# Patient Record
Sex: Male | Born: 1985 | Hispanic: No | Smoking: Current every day smoker
Health system: Southern US, Community
[De-identification: ages and names within clinical notes are randomized; demographics above are authoritative.]

## PROBLEM LIST (undated history)

## (undated) DIAGNOSIS — R519 Headache, unspecified: Secondary | ICD-10-CM

## (undated) DIAGNOSIS — R51 Headache: Secondary | ICD-10-CM

## (undated) DIAGNOSIS — R42 Dizziness and giddiness: Secondary | ICD-10-CM

---

## 2014-04-04 ENCOUNTER — Emergency Department (HOSPITAL_COMMUNITY)
Admission: EM | Admit: 2014-04-04 | Discharge: 2014-04-04 | Disposition: A | Discharge status: Home / Self Care | Payer: Medicaid Other | Attending: Emergency Medicine | Admitting: Emergency Medicine

## 2014-04-04 ENCOUNTER — Encounter (HOSPITAL_COMMUNITY): Payer: Self-pay | Admitting: Emergency Medicine

## 2014-04-04 DIAGNOSIS — F17200 Nicotine dependence, unspecified, uncomplicated: Secondary | ICD-10-CM | POA: Diagnosis not present

## 2014-04-04 DIAGNOSIS — R197 Diarrhea, unspecified: Secondary | ICD-10-CM | POA: Diagnosis not present

## 2014-04-04 DIAGNOSIS — R42 Dizziness and giddiness: Secondary | ICD-10-CM | POA: Insufficient documentation

## 2014-04-04 DIAGNOSIS — R112 Nausea with vomiting, unspecified: Secondary | ICD-10-CM | POA: Diagnosis not present

## 2014-04-04 LAB — URINALYSIS, ROUTINE W REFLEX MICROSCOPIC
Bilirubin Urine: NEGATIVE
Glucose, UA: NEGATIVE mg/dL
Hgb urine dipstick: NEGATIVE
KETONES UR: NEGATIVE mg/dL
LEUKOCYTES UA: NEGATIVE
NITRITE: NEGATIVE
PROTEIN: NEGATIVE mg/dL
Specific Gravity, Urine: 1.015 (ref 1.005–1.030)
UROBILINOGEN UA: 0.2 mg/dL (ref 0.0–1.0)
pH: 7 (ref 5.0–8.0)

## 2014-04-04 LAB — CBC WITH DIFFERENTIAL/PLATELET
BASOS ABS: 0.1 10*3/uL (ref 0.0–0.1)
Basophils Relative: 1 % (ref 0–1)
Eosinophils Absolute: 0.2 10*3/uL (ref 0.0–0.7)
Eosinophils Relative: 2 % (ref 0–5)
HCT: 43.6 % (ref 39.0–52.0)
Hemoglobin: 15.2 g/dL (ref 13.0–17.0)
LYMPHS PCT: 35 % (ref 12–46)
Lymphs Abs: 3.1 10*3/uL (ref 0.7–4.0)
MCH: 29.5 pg (ref 26.0–34.0)
MCHC: 34.9 g/dL (ref 30.0–36.0)
MCV: 84.7 fL (ref 78.0–100.0)
Monocytes Absolute: 0.6 10*3/uL (ref 0.1–1.0)
Monocytes Relative: 7 % (ref 3–12)
Neutro Abs: 5 10*3/uL (ref 1.7–7.7)
Neutrophils Relative %: 55 % (ref 43–77)
PLATELETS: 310 10*3/uL (ref 150–400)
RBC: 5.15 MIL/uL (ref 4.22–5.81)
RDW: 12.6 % (ref 11.5–15.5)
WBC: 8.9 10*3/uL (ref 4.0–10.5)

## 2014-04-04 LAB — BASIC METABOLIC PANEL
ANION GAP: 11 (ref 5–15)
BUN: 12 mg/dL (ref 6–23)
CALCIUM: 9.2 mg/dL (ref 8.4–10.5)
CO2: 28 mEq/L (ref 19–32)
Chloride: 101 mEq/L (ref 96–112)
Creatinine, Ser: 1.06 mg/dL (ref 0.50–1.35)
GFR calc Af Amer: >90 mL/min (ref >90)
Glucose, Bld: 85 mg/dL (ref 70–99)
Potassium: 3.8 mEq/L (ref 3.7–5.3)
SODIUM: 140 meq/L (ref 137–147)

## 2014-04-04 MED ORDER — ONDANSETRON HCL 4 MG/2ML IJ SOLN
4.0000 mg | Freq: Once | INTRAMUSCULAR | Status: AC
  Administered 2014-04-04: 4 mg via INTRAVENOUS
  Filled 2014-04-04: qty 2

## 2014-04-04 MED ORDER — ONDANSETRON HCL 4 MG PO TABS: 4.0000 mg | ORAL_TABLET | Freq: Four times a day (QID) | ORAL | Status: DC | PRN

## 2014-04-04 MED ORDER — LOPERAMIDE HCL 2 MG PO CAPS: 2.0000 mg | ORAL_CAPSULE | Freq: Four times a day (QID) | ORAL | Status: DC | PRN

## 2014-04-04 MED ORDER — SODIUM CHLORIDE 0.9 % IV SOLN
1000.0000 mL | Freq: Once | INTRAVENOUS | Status: AC
  Administered 2014-04-04: 1000 mL via INTRAVENOUS

## 2014-04-04 MED ORDER — SODIUM CHLORIDE 0.9 % IV SOLN: 1000.0000 mL | INTRAVENOUS | Status: DC

## 2014-04-04 MED ORDER — LOPERAMIDE HCL 2 MG PO CAPS
4.0000 mg | ORAL_CAPSULE | Freq: Once | ORAL | Status: AC
  Administered 2014-04-04: 4 mg via ORAL
  Filled 2014-04-04: qty 2

## 2014-04-04 NOTE — ED Notes (Signed)
Pt alert & oriented x4, stable gait. Patient given discharge instructions, paperwork & prescription(s). Patient  instructed to stop at the registration desk to finish any additional paperwork. Patient verbalized understanding. Pt left department w/ no further questions. 

## 2014-04-04 NOTE — Discharge Instructions (Signed)
Nausea and Vomiting °Nausea is a sick feeling that often comes before throwing up (vomiting). Vomiting is a reflex where stomach contents come out of your mouth. Vomiting can cause severe loss of body fluids (dehydration). Children and elderly adults can become dehydrated quickly, especially if they also have diarrhea. Nausea and vomiting are symptoms of a condition or disease. It is important to find the cause of your symptoms. °CAUSES  °· Direct irritation of the stomach lining. This irritation can result from increased acid production (gastroesophageal reflux disease), infection, food poisoning, taking certain medicines (such as nonsteroidal anti-inflammatory drugs), alcohol use, or tobacco use. °· Signals from the brain. These signals could be caused by a headache, heat exposure, an inner ear disturbance, increased pressure in the brain from injury, infection, a tumor, or a concussion, pain, emotional stimulus, or metabolic problems. °· An obstruction in the gastrointestinal tract (bowel obstruction). °· Illnesses such as diabetes, hepatitis, gallbladder problems, appendicitis, kidney problems, cancer, sepsis, atypical symptoms of a heart attack, or eating disorders. °· Medical treatments such as chemotherapy and radiation. °· Receiving medicine that makes you sleep (general anesthetic) during surgery. °DIAGNOSIS °Your caregiver may ask for tests to be done if the problems do not improve after a few days. Tests may also be done if symptoms are severe or if the reason for the nausea and vomiting is not clear. Tests may include: °· Urine tests. °· Blood tests. °· Stool tests. °· Cultures (to look for evidence of infection). °· X-rays or other imaging studies. °Test results can help your caregiver make decisions about treatment or the need for additional tests. °TREATMENT °You need to stay well hydrated. Drink frequently but in small amounts. You may wish to drink water, sports drinks, clear broth, or eat frozen  ice pops or gelatin dessert to help stay hydrated. When you eat, eating slowly may help prevent nausea. There are also some antinausea medicines that may help prevent nausea. °HOME CARE INSTRUCTIONS  °· Take all medicine as directed by your caregiver. °· If you do not have an appetite, do not force yourself to eat. However, you must continue to drink fluids. °· If you have an appetite, eat a normal diet unless your caregiver tells you differently. °¨ Eat a variety of complex carbohydrates (rice, wheat, potatoes, bread), lean meats, yogurt, fruits, and vegetables. °¨ Avoid high-fat foods because they are more difficult to digest. °· Drink enough water and fluids to keep your urine clear or pale yellow. °· If you are dehydrated, ask your caregiver for specific rehydration instructions. Signs of dehydration may include: °¨ Severe thirst. °¨ Dry lips and mouth. °¨ Dizziness. °¨ Dark urine. °¨ Decreasing urine frequency and amount. °¨ Confusion. °¨ Rapid breathing or pulse. °SEEK IMMEDIATE MEDICAL CARE IF:  °· You have blood or brown flecks (like coffee grounds) in your vomit. °· You have black or bloody stools. °· You have a severe headache or stiff neck. °· You are confused. °· You have severe abdominal pain. °· You have chest pain or trouble breathing. °· You do not urinate at least once every 8 hours. °· You develop cold or clammy skin. °· You continue to vomit for longer than 24 to 48 hours. °· You have a fever. °MAKE SURE YOU:  °· Understand these instructions. °· Will watch your condition. °· Will get help right away if you are not doing well or get worse. °Document Released: 07/22/2005 Document Revised: 10/14/2011 Document Reviewed: 12/19/2010 °ExitCare® Patient Information ©2015 [employer]. This information is not intended   to replace advice given to you by your health care provider. Make sure you discuss any questions you have with your health care provider. ° °Diarrhea °Diarrhea is frequent loose and watery  bowel movements. It can cause you to feel weak and dehydrated. Dehydration can cause you to become tired and thirsty, have a dry mouth, and have decreased urination that often is dark yellow. Diarrhea is a sign of another problem, most often an infection that will not last long. In most cases, diarrhea typically lasts 2-3 days. However, it can last longer if it is a sign of something more serious. It is important to treat your diarrhea as directed by your caregiver to lessen or prevent future episodes of diarrhea. °CAUSES  °Some common causes include: °· Gastrointestinal infections caused by viruses, bacteria, or parasites. °· Food poisoning or food allergies. °· Certain medicines, such as antibiotics, chemotherapy, and laxatives. °· Artificial sweeteners and fructose. °· Digestive disorders. °HOME CARE INSTRUCTIONS °· Ensure adequate fluid intake (hydration): Have 1 cup (8 oz) of fluid for each diarrhea episode. Avoid fluids that contain simple sugars or sports drinks, fruit juices, whole milk products, and sodas. Your urine should be clear or pale yellow if you are drinking enough fluids. Hydrate with an oral rehydration solution that you can purchase at pharmacies, retail stores, and online. You can prepare an oral rehydration solution at home by mixing the following ingredients together: °¨  - tsp table salt. °¨ ¾ tsp baking soda. °¨  tsp salt substitute containing potassium chloride. °¨ 1  tablespoons sugar. °¨ 1 L (34 oz) of water. °· Certain foods and beverages may increase the speed at which food moves through the gastrointestinal (GI) tract. These foods and beverages should be avoided and include: °¨ Caffeinated and alcoholic beverages. °¨ High-fiber foods, such as raw fruits and vegetables, nuts, seeds, and whole grain breads and cereals. °¨ Foods and beverages sweetened with sugar alcohols, such as xylitol, sorbitol, and mannitol. °· Some foods may be well tolerated and may help thicken stool  including: °¨ Starchy foods, such as rice, toast, pasta, low-sugar cereal, oatmeal, grits, baked potatoes, crackers, and bagels. °¨ Bananas. °¨ Applesauce. °· Add probiotic-rich foods to help increase healthy bacteria in the GI tract, such as yogurt and fermented milk products. °· Wash your hands well after each diarrhea episode. °· Only take over-the-counter or prescription medicines as directed by your caregiver. °· Take a warm bath to relieve any burning or pain from frequent diarrhea episodes. °SEEK IMMEDIATE MEDICAL CARE IF:  °· You are unable to keep fluids down. °· You have persistent vomiting. °· You have blood in your stool, or your stools are black and tarry. °· You do not urinate in 6-8 hours, or there is only a small amount of very dark urine. °· You have abdominal pain that increases or localizes. °· You have weakness, dizziness, confusion, or light-headedness. °· You have a severe headache. °· Your diarrhea gets worse or does not get better. °· You have a fever or persistent symptoms for more than 2-3 days. °· You have a fever and your symptoms suddenly get worse. °MAKE SURE YOU:  °· Understand these instructions. °· Will watch your condition. °· Will get help right away if you are not doing well or get worse. °Document Released: 07/12/2002 Document Revised: 12/06/2013 Document Reviewed: 03/29/2012 °ExitCare® Patient Information ©2015 [employer]. This information is not intended to replace advice given to you by your health care provider. Make sure you discuss   any questions you have with your health care provider.  Ondansetron tablets What is this medicine? ONDANSETRON (on DAN se tron) is used to treat nausea and vomiting caused by chemotherapy. It is also used to prevent or treat nausea and vomiting after surgery. This medicine may be used for other purposes; ask your health care provider or pharmacist if you have questions. COMMON BRAND NAME(S): Zofran What should I tell my health care  provider before I take this medicine? They need to know if you have any of these conditions: -heart disease -history of irregular heartbeat -liver disease -low levels of magnesium or potassium in the blood -an unusual or allergic reaction to ondansetron, granisetron, other medicines, foods, dyes, or preservatives -pregnant or trying to get pregnant -breast-feeding How should I use this medicine? Take this medicine by mouth with a glass of water. Follow the directions on your prescription label. Take your doses at regular intervals. Do not take your medicine more often than directed. Talk to your pediatrician regarding the use of this medicine in children. Special care may be needed. Overdosage: If you think you have taken too much of this medicine contact a poison control center or emergency room at once. NOTE: This medicine is only for you. Do not share this medicine with others. What if I miss a dose? If you miss a dose, take it as soon as you can. If it is almost time for your next dose, take only that dose. Do not take double or extra doses. What may interact with this medicine? Do not take this medicine with any of the following medications: -apomorphine -certain medicines for fungal infections like fluconazole, itraconazole, ketoconazole, posaconazole, voriconazole -cisapride -dofetilide -dronedarone -pimozide -thioridazine -ziprasidone This medicine may also interact with the following medications: -carbamazepine -certain medicines for depression, anxiety, or psychotic disturbances -fentanyl -linezolid -MAOIs like Carbex, Eldepryl, Marplan, Nardil, and Parnate -methylene blue (injected into a vein) -other medicines that prolong the QT interval (cause an abnormal heart rhythm) -phenytoin -rifampicin -tramadol This list may not describe all possible interactions. Give your health care provider a list of all the medicines, herbs, non-prescription drugs, or dietary supplements  you use. Also tell them if you smoke, drink alcohol, or use illegal drugs. Some items may interact with your medicine. What should I watch for while using this medicine? Check with your doctor or health care professional right away if you have any sign of an allergic reaction. What side effects may I notice from receiving this medicine? Side effects that you should report to your doctor or health care professional as soon as possible: -allergic reactions like skin rash, itching or hives, swelling of the face, lips or tongue -breathing problems -confusion -dizziness -fast or irregular heartbeat -feeling faint or lightheaded, falls -fever and chills -loss of balance or coordination -seizures -sweating -swelling of the hands or feet -tightness in the chest -tremors -unusually weak or tired Side effects that usually do not require medical attention (report to your doctor or health care professional if they continue or are bothersome): -constipation or diarrhea -headache This list may not describe all possible side effects. Call your doctor for medical advice about side effects. You may report side effects to FDA at 1-800-FDA-1088. Where should I keep my medicine? Keep out of the reach of children. Store between 2 and 30 degrees C (36 and 86 degrees F). Throw away any unused medicine after the expiration date. NOTE: This sheet is a summary. It may not cover all possible information. If  you have questions about this medicine, talk to your doctor, pharmacist, or health care provider. °© 2015, [employer]. (2013-04-28 16:27:45) ° °Loperamide tablets or capsules °What is this medicine? °LOPERAMIDE (loe PER a mide) is used to treat diarrhea. °This medicine may be used for other purposes; ask your health care provider or pharmacist if you have questions. °COMMON BRAND NAME(S): Anti-Diarrheal, Imodium A-D, K-Pek II °What should I tell my health care provider before I take this  medicine? °They need to know if you have any of these conditions: °-a black or bloody stool °-bacterial food poisoning °-colitis or mucus in your stool °-currently taking an antibiotic medication for an infection °-fever °-liver disease °-severe abdominal pain, swelling or bulging °-an unusual or allergic reaction to loperamide, other medicines, foods, dyes, or preservatives °-pregnant or trying to get pregnant °-breast-feeding °How should I use this medicine? °Take this medicine by mouth with a glass of water. Follow the directions on the prescription label. Take your doses at regular intervals. Do not take your medicine more often than directed. °Talk to your pediatrician regarding the use of this medicine in children. Special care may be needed. °Overdosage: If you think you have taken too much of this medicine contact a poison control center or emergency room at once. °NOTE: This medicine is only for you. Do not share this medicine with others. °What if I miss a dose? °This does not apply. This medicine is not for regular use. Only take this medicine while you continue to have loose bowel movements. Do not take more medicine than recommended by the packaging label or by your healthcare professional. °What may interact with this medicine? °Do not take this medicine with any of the following medications: °-alosetron °This medicine may also interact with the following medications: °-quinidine °-ritonavir °-saquinavir °This list may not describe all possible interactions. Give your health care provider a list of all the medicines, herbs, non-prescription drugs, or dietary supplements you use. Also tell them if you smoke, drink alcohol, or use illegal drugs. Some items may interact with your medicine. °What should I watch for while using this medicine? °Do not take this medicine for more than 1 week without asking your doctor or health care professional. If your symptoms do not start to get better after two days, you  may have a problem that needs further evaluation. Check with your doctor or health care professional right away if you develop a fever, severe abdominal pain, swelling or bulging, or if you have have bloody/black diarrhea or stools. °You may get drowsy or dizzy. Do not drive, use machinery, or do anything that needs mental alertness until you know how this medicine affects you. Do not stand or sit up quickly, especially if you are an older patient. This reduces the risk of dizzy or fainting spells. Alcohol can increase possible drowsiness and dizziness. Avoid alcoholic drinks. °Your mouth may get dry. Chewing sugarless gum or sucking hard candy, and drinking plenty of water may help. Contact your doctor if the problem does not go away or is severe. Drinking plenty of water can also help prevent dehydration that can occur with diarrhea. °Elderly patients may have a more variable response to the effects of this medicine, and are more susceptible to the effects of dehydration. °What side effects may I notice from receiving this medicine? °Side effects that you should report to your doctor or health care professional as soon as possible: °-allergic reactions like skin rash, itching or hives, swelling of the   face, lips, or tongue -bloated, swollen feeling in your abdomen -blurred vision -loss of appetite -stomach pain Side effects that usually do not require medical attention (report to your doctor or health care professional if they continue or are bothersome): -constipation -drowsiness or dizziness -dry mouth -nausea, vomiting This list may not describe all possible side effects. Call your doctor for medical advice about side effects. You may report side effects to FDA at 1-800-FDA-1088. Where should I keep my medicine? Keep out of the reach of children. Store at room temperature between 15 and 25 degrees C (59 and 77 degrees F). Keep container tightly closed. Throw away any unused medicine after the  expiration date. NOTE: This sheet is a summary. It may not cover all possible information. If you have questions about this medicine, talk to your doctor, pharmacist, or health care provider.  2015, [employer]. (2008-01-26 16:02:13)

## 2014-04-04 NOTE — ED Notes (Signed)
Received report from Andy Gauss RN. Pt provided urine sample, walked sample to lab.

## 2014-04-04 NOTE — ED Notes (Signed)
Pt c/o n/v/d since 3am today; pt states he is feeling dizzy

## 2014-04-04 NOTE — ED Provider Notes (Signed)
CSN: [id number]     Arrival date & time 04/04/14  4098 History   First MD Initiated Contact with Patient 04/04/14 [phone]     Chief Complaint  Patient presents with  . Dizziness  . Nausea  . Diarrhea     (Consider location/radiation/quality/duration/timing/severity/associated sxs/prior Treatment) Patient is a 28 y.o. male presenting with dizziness and diarrhea. The history is provided by the patient.  Dizziness Associated symptoms: diarrhea   Diarrhea He had onset at about 3:30 PM of nausea, vomiting, diarrhea. He has vomited twice and has had numerous loose bowel movements. There has been no blood or mucus in stool or emesis. He denies fever, chills, sweats. He denies any sick contacts and denies eating any tainted food. There is been some mild abdominal cramping around the time he has a bowel movement. He has not taken any treatment at home. He is complaining of feeling dizzy when he is up and walking around.  History reviewed. No pertinent past medical history. History reviewed. No pertinent past surgical history. History reviewed. No pertinent family history. History  Substance Use Topics  . Smoking status: Smoker, Current Status Unknown  . Smokeless tobacco: Not on file  . Alcohol Use: Yes     Comment: rarely    Review of Systems  Gastrointestinal: Positive for diarrhea.  Neurological: Positive for dizziness.  All other systems reviewed and are negative.     Allergies  Review of patient's allergies indicates no known allergies.  Home Medications   Prior to Admission medications   Not on File   BP 124/77  Pulse 63  Resp 16  Ht  (1.676 m)  Wt 230 lb (104.327 kg)  BMI 37.14 kg/m2  SpO2 97% Physical Exam  Nursing note and vitals reviewed.  28 year old male, resting comfortably and in no acute distress. Vital signs are normal. Oxygen saturation is 97%, which is normal. Head is normocephalic and atraumatic. PERRLA, EOMI. Oropharynx is clear. Neck is nontender  and supple without adenopathy or JVD. Back is nontender and there is no CVA tenderness. Lungs are clear without rales, wheezes, or rhonchi. Chest is nontender. Heart has regular rate and rhythm without murmur. Abdomen is soft, flat, nontender without masses or hepatosplenomegaly and peristalsis is hypoactive. Extremities have no cyanosis or edema, full range of motion is present. Skin is warm and dry without rash. Neurologic: Mental status is normal, cranial nerves are intact, there are no motor or sensory deficits.  ED Course  Procedures (including critical care time) Labs Review Results for orders placed during the hospital encounter of 04/04/14  CBC WITH DIFFERENTIAL      Result Value Ref Range   WBC 8.9  4.0 - 10.5 K/uL   RBC 5.15  4.22 - 5.81 MIL/uL   Hemoglobin 15.2  13.0 - 17.0 g/dL   HCT 47.8  29.5 - 62.1 %   MCV 84.7  78.0 - 100.0 fL   MCH 29.5  26.0 - 34.0 pg   MCHC 34.9  30.0 - 36.0 g/dL   RDW 30.8  65.7 - 84.6 %   Platelets 310  150 - 400 K/uL   Neutrophils Relative % 55  43 - 77 %   Neutro Abs 5.0  1.7 - 7.7 K/uL   Lymphocytes Relative 35  12 - 46 %   Lymphs Abs 3.1  0.7 - 4.0 K/uL   Monocytes Relative 7  3 - 12 %   Monocytes Absolute 0.6  0.1 - 1.0 K/uL  Eosinophils Relative 2  0 - 5 %   Eosinophils Absolute 0.2  0.0 - 0.7 K/uL   Basophils Relative 1  0 - 1 %   Basophils Absolute 0.1  0.0 - 0.1 K/uL  BASIC METABOLIC PANEL      Result Value Ref Range   Sodium 140  137 - 147 mEq/L   Potassium 3.8  3.7 - 5.3 mEq/L   Chloride 101  96 - 112 mEq/L   CO2 28  19 - 32 mEq/L   Glucose, Bld 85  70 - 99 mg/dL   BUN 12  6 - 23 mg/dL   Creatinine, Ser 7.82  0.50 - 1.35 mg/dL   Calcium 9.2  8.4 - 95.6 mg/dL   GFR calc non Af Amer >90  >90 mL/min   GFR calc Af Amer >90  >90 mL/min   Anion gap 11  5 - 15  URINALYSIS, ROUTINE W REFLEX MICROSCOPIC      Result Value Ref Range   Color, Urine YELLOW  YELLOW   APPearance CLEAR  CLEAR   Specific Gravity, Urine 1.015  1.005  - 1.030   pH 7.0  5.0 - 8.0   Glucose, UA NEGATIVE  NEGATIVE mg/dL   Hgb urine dipstick NEGATIVE  NEGATIVE   Bilirubin Urine NEGATIVE  NEGATIVE   Ketones, ur NEGATIVE  NEGATIVE mg/dL   Protein, ur NEGATIVE  NEGATIVE mg/dL   Urobilinogen, UA 0.2  0.0 - 1.0 mg/dL   Nitrite NEGATIVE  NEGATIVE   Leukocytes, UA NEGATIVE  NEGATIVE   MDM   Final diagnoses:  Nausea vomiting and diarrhea    Vomiting and diarrhea which most likely represents a viral gastroenteritis. He does not appear toxic. He'll begin IV hydration, IV ondansetron, and oral loperamide.  He feels much better after above noted treatment. Laboratory workup was unremarkable. He is discharged with prescriptions for ondansetron and loperamide.  Dione Booze, MD 04/04/14 [phone]

## 2014-04-04 NOTE — ED Notes (Signed)
Dr. Glick at bedside.  

## 2015-04-25 ENCOUNTER — Emergency Department (HOSPITAL_COMMUNITY)
Admission: EM | Admit: 2015-04-25 | Discharge: 2015-04-25 | Disposition: A | Discharge status: Home / Self Care | Payer: Medicaid Other | Attending: Emergency Medicine | Admitting: Emergency Medicine

## 2015-04-25 ENCOUNTER — Encounter (HOSPITAL_COMMUNITY): Payer: Self-pay | Admitting: Emergency Medicine

## 2015-04-25 ENCOUNTER — Emergency Department (HOSPITAL_COMMUNITY): Payer: Medicaid Other

## 2015-04-25 DIAGNOSIS — Y9301 Activity, walking, marching and hiking: Secondary | ICD-10-CM | POA: Insufficient documentation

## 2015-04-25 DIAGNOSIS — Y9289 Other specified places as the place of occurrence of the external cause: Secondary | ICD-10-CM | POA: Insufficient documentation

## 2015-04-25 DIAGNOSIS — Y998 Other external cause status: Secondary | ICD-10-CM | POA: Diagnosis not present

## 2015-04-25 DIAGNOSIS — E860 Dehydration: Secondary | ICD-10-CM

## 2015-04-25 DIAGNOSIS — T675XXA Heat exhaustion, unspecified, initial encounter: Secondary | ICD-10-CM

## 2015-04-25 DIAGNOSIS — X30XXXA Exposure to excessive natural heat, initial encounter: Secondary | ICD-10-CM | POA: Insufficient documentation

## 2015-04-25 LAB — CBC WITH DIFFERENTIAL/PLATELET
Basophils Absolute: 0.1 10*3/uL (ref 0.0–0.1)
Basophils Relative: 0 %
EOS ABS: 0.1 10*3/uL (ref 0.0–0.7)
Eosinophils Relative: 0 %
HCT: 45.3 % (ref 39.0–52.0)
Hemoglobin: 15.6 g/dL (ref 13.0–17.0)
Lymphocytes Relative: 16 %
Lymphs Abs: 2.8 10*3/uL (ref 0.7–4.0)
MCH: 29.8 pg (ref 26.0–34.0)
MCHC: 34.4 g/dL (ref 30.0–36.0)
MCV: 86.5 fL (ref 78.0–100.0)
MONOS PCT: 9 %
Monocytes Absolute: 1.6 10*3/uL — ABNORMAL HIGH (ref 0.1–1.0)
NEUTROS PCT: 75 %
Neutro Abs: 13.6 10*3/uL — ABNORMAL HIGH (ref 1.7–7.7)
Platelets: 297 10*3/uL (ref 150–400)
RBC: 5.24 MIL/uL (ref 4.22–5.81)
RDW: 12.3 % (ref 11.5–15.5)
WBC: 18.2 10*3/uL — ABNORMAL HIGH (ref 4.0–10.5)

## 2015-04-25 LAB — COMPREHENSIVE METABOLIC PANEL
ALK PHOS: 97 U/L (ref 38–126)
ALT: 77 U/L — AB (ref 17–63)
AST: 41 U/L (ref 15–41)
Albumin: 4.8 g/dL (ref 3.5–5.0)
Anion gap: 8 (ref 5–15)
BILIRUBIN TOTAL: 1.1 mg/dL (ref 0.3–1.2)
BUN: 17 mg/dL (ref 6–20)
CALCIUM: 9 mg/dL (ref 8.9–10.3)
CO2: 27 mmol/L (ref 22–32)
CREATININE: 1.12 mg/dL (ref 0.61–1.24)
Chloride: 105 mmol/L (ref 101–111)
GFR calc non Af Amer: >60 mL/min (ref >60)
Glucose, Bld: 103 mg/dL — ABNORMAL HIGH (ref 65–99)
Potassium: 3.9 mmol/L (ref 3.5–5.1)
SODIUM: 140 mmol/L (ref 135–145)
Total Protein: 7.9 g/dL (ref 6.5–8.1)

## 2015-04-25 LAB — CK: Total CK: 635 U/L — ABNORMAL HIGH (ref 49–397)

## 2015-04-25 MED ORDER — SODIUM CHLORIDE 0.9 % IV SOLN
1000.0000 mL | Freq: Once | INTRAVENOUS | Status: AC
  Administered 2015-04-25: 1000 mL via INTRAVENOUS

## 2015-04-25 MED ORDER — SODIUM CHLORIDE 0.9 % IV BOLUS (SEPSIS)
1000.0000 mL | Freq: Once | INTRAVENOUS | Status: AC
  Administered 2015-04-25: 1000 mL via INTRAVENOUS

## 2015-04-25 MED ORDER — SODIUM CHLORIDE 0.9 % IV SOLN
1000.0000 mL | INTRAVENOUS | Status: DC
  Administered 2015-04-25 (×2): 1000 mL via INTRAVENOUS

## 2015-04-25 MED ORDER — KETOROLAC TROMETHAMINE 30 MG/ML IJ SOLN
30.0000 mg | Freq: Once | INTRAMUSCULAR | Status: AC
  Administered 2015-04-25: 30 mg via INTRAVENOUS
  Filled 2015-04-25: qty 1

## 2015-04-25 NOTE — ED Provider Notes (Signed)
CSN: [id number]     Arrival date & time 04/25/15  0305 History   First MD Initiated Contact with Patient 04/25/15 0330     Chief Complaint  Patient presents with  . Dehydration     (Consider location/radiation/quality/duration/timing/severity/associated sxs/prior Treatment) HPI  Patient states he was arrested in [facility] however he had outstanding warrant in [place] and he was taken to [place]. He states he just got out of jail about 7 PM tonight. He states he didn't have a cell phone and could not remember any friends or family's phone numbers. They all have cell phones and no home phone. He was trying to walk home and got about 9 or 10 miles however he felt he couldn't go any farther. He is having pain and indicates in his groin area bilaterally. He describes it as a cramping feeling. He states he's never had pain there before. He states his feet are sore. He states he was sweating a lot. He had some mild shortness of breath without chest pain. He complains of dry mouth but denies nausea or vomiting.   PCP none  History reviewed. No pertinent past medical history. History reviewed. No pertinent past surgical history. No family history on file. Social History  Substance Use Topics  . Smoking status: Smoker, Current Status Unknown  . Smokeless tobacco: None  . Alcohol Use: Yes     Comment: rarely  unemployed Smokes vapes  Review of Systems  All other systems reviewed and are negative.     Allergies  Review of patient's allergies indicates no known allergies.  Home Medications   Prior to Admission medications   Medication Sig Start Date End Date Taking? Authorizing Provider  loperamide (IMODIUM) 2 MG capsule Take 1 capsule (2 mg total) by mouth 4 (four) times daily as needed for diarrhea or loose stools. 04/04/14   Dione Booze, MD  ondansetron (ZOFRAN) 4 MG tablet Take 1 tablet (4 mg total) by mouth every 6 (six) hours as needed. 04/04/14   Dione Booze, MD    BP 140/93 mmHg  Pulse 94  Temp(Src) 98.3 F (36.8 C) (Oral)  Resp 20  Ht  (1.676 m)  Wt 233 lb (105.688 kg)  BMI 37.63 kg/m2  SpO2 97%  Vital signs normal   Physical Exam  Constitutional: He is oriented to person, place, and time. He appears well-developed and well-nourished.  Non-toxic appearance. He does not appear ill. No distress.  HENT:  Head: Normocephalic and atraumatic.  Right Ear: External ear normal.  Left Ear: External ear normal.  Nose: Nose normal. No mucosal edema or rhinorrhea.  Mouth/Throat: Mucous membranes are normal. No dental abscesses or uvula swelling.  Dry tongue  Eyes: Conjunctivae and EOM are normal. Pupils are equal, round, and reactive to light.  Neck: Normal range of motion and full passive range of motion without pain. Neck supple.  Cardiovascular: Normal rate, regular rhythm and normal heart sounds.  Exam reveals no gallop and no friction rub.   No murmur heard. Pulmonary/Chest: Effort normal and breath sounds normal. No respiratory distress. He has no wheezes. He has no rhonchi. He has no rales. He exhibits no tenderness and no crepitus.  Abdominal: Soft. Normal appearance and bowel sounds are normal. He exhibits no distension. There is no tenderness. There is no rebound and no guarding.  Musculoskeletal: Normal range of motion. He exhibits no edema or tenderness.  Moves all extremities well.  Patient indicates he has tenderness over the true hip joint  bilaterally. His calves and thighs are nontender. He's noted to have black staining on the soles of his feet either from his sandals or from walking on the road  Neurological: He is alert and oriented to person, place, and time. He has normal strength. No cranial nerve deficit.  Skin: Skin is warm, dry and intact. No rash noted. No erythema. No pallor.  Psychiatric: He has a normal mood and affect. His speech is normal and behavior is normal. His mood appears not anxious.  Nursing note and  vitals reviewed.   ED Course  Procedures (including critical care time)  Medications  0.9 %  sodium chloride infusion (1,000 mLs Intravenous New Bag/Given 04/25/15 0544)    Followed by  0.9 %  sodium chloride infusion (0 mLs Intravenous Stopped 04/25/15 0544)    Followed by  0.9 %  sodium chloride infusion (0 mLs Intravenous Stopped 04/25/15 0504)  sodium chloride 0.9 % bolus 1,000 mL (not administered)  ketorolac (TORADOL) 30 MG/ML injection 30 mg (30 mg Intravenous Given 04/25/15 0407)   Patient was given IV fluids and IV Toradol for his body aches.  Recheck at 5 AM patient is just finishing his second liter of IV fluids. He still does not feel the need to urinate. He was given a third liter of IV fluids.  Patient was given his x-ray results. Patient most likely had some heat exhaustion and improved with IV fluids. He had no evidence for rhabdomyolysis.   Labs Review Results for orders placed or performed during the hospital encounter of 04/25/15  Comprehensive metabolic panel  Result Value Ref Range   Sodium 140 135 - 145 mmol/L   Potassium 3.9 3.5 - 5.1 mmol/L   Chloride 105 101 - 111 mmol/L   CO2 27 22 - 32 mmol/L   Glucose, Bld 103 (H) 65 - 99 mg/dL   BUN 17 6 - 20 mg/dL   Creatinine, Ser 1.61 0.61 - 1.24 mg/dL   Calcium 9.0 8.9 - 09.6 mg/dL   Total Protein 7.9 6.5 - 8.1 g/dL   Albumin 4.8 3.5 - 5.0 g/dL   AST 41 15 - 41 U/L   ALT 77 (H) 17 - 63 U/L   Alkaline Phosphatase 97 38 - 126 U/L   Total Bilirubin 1.1 0.3 - 1.2 mg/dL   GFR calc non Af Amer >60 >60 mL/min   GFR calc Af Amer >60 >60 mL/min   Anion gap 8 5 - 15  CBC with Differential  Result Value Ref Range   WBC 18.2 (H) 4.0 - 10.5 K/uL   RBC 5.24 4.22 - 5.81 MIL/uL   Hemoglobin 15.6 13.0 - 17.0 g/dL   HCT 04.5 40.9 - 81.1 %   MCV 86.5 78.0 - 100.0 fL   MCH 29.8 26.0 - 34.0 pg   MCHC 34.4 30.0 - 36.0 g/dL   RDW 91.4 78.2 - 95.6 %   Platelets 297 150 - 400 K/uL   Neutrophils Relative % 75 %   Neutro Abs  13.6 (H) 1.7 - 7.7 K/uL   Lymphocytes Relative 16 %   Lymphs Abs 2.8 0.7 - 4.0 K/uL   Monocytes Relative 9 %   Monocytes Absolute 1.6 (H) 0.1 - 1.0 K/uL   Eosinophils Relative 0 %   Eosinophils Absolute 0.1 0.0 - 0.7 K/uL   Basophils Relative 0 %   Basophils Absolute 0.1 0.0 - 0.1 K/uL  CK  Result Value Ref Range   Total CK 635 (H) 49 - 397 U/L  Laboratory interpretation all normal except mild elevation of total CK   Imaging Review Dg Pelvis 1-2 Views  04/25/2015   CLINICAL DATA:  Initial evaluation for bilateral hip pain after walking 9 miles. No injury.  EXAM: PELVIS - 1-2 VIEW  COMPARISON:  None.  FINDINGS: There is no evidence of pelvic fracture or diastasis. No pelvic bone lesions are seen. No radiographic evidence for significant degenerative or erosive arthropathy. SI joints within normal limits. No soft tissue abnormality.  IMPRESSION: Negative.   Electronically Signed   By: Rise Mu M.D.   On: 04/25/2015 04:48   I have personally reviewed and evaluated these images and lab results as part of my medical decision-making.   EKG Interpretation None      MDM   Final diagnoses:  Dehydration  Heat exhaustion, initial encounter   Plan discharge  Devoria Albe, MD, Concha Pyo, MD 04/25/15 [phone]

## 2015-04-25 NOTE — ED Notes (Signed)
Pt given cup of water 

## 2015-04-25 NOTE — ED Notes (Signed)
Pt states he was released from jail last night and walked about 9 miles and c/o bilateral leg cramps.

## 2015-04-25 NOTE — Discharge Instructions (Signed)
Drink plenty of fluids. You can take ibuprofen 600 mg 4 times a day for body aches.  Recheck as needed.  Heat-Related Illness Heat-related illnesses occur when the body is unable to properly cool itself. The body normally cools itself by sweating. However, under some conditions sweating is not enough. In these cases, a person's body temperature rises rapidly. Very high body temperatures may damage the brain or other vital organs. Some examples of heat-related illnesses include:  Heat stroke. This occurs when the body is unable to regulate its temperature. The body's temperature rises rapidly, the sweating mechanism fails, and the body is unable to cool down. Body temperature may rise to 106 F (41 C) or higher within 10 to 15 minutes. Heat stroke can cause death or permanent disability if emergency treatment is not provided.  Heat exhaustion. This is a milder form of heat-related illness that can develop after several days of exposure to high temperatures and not enough fluids. It is the body's response to an excessive loss of the water and salt contained in sweat.  Heat cramps. These usually affect people who sweat a lot during heavy activity. This sweating drains the body's salt and moisture. The low salt level in the muscles causes painful cramps. Heat cramps may also be a symptom of heat exhaustion. Heat cramps usually occur in the abdomen, arms, or legs. Get medical attention for cramps if you have heart problems or are on a low-sodium diet. Those that are at greatest risk for heat-related illnesses include:   The elderly.  Infant and the very young.  People with mental illness and chronic diseases.  People who are overweight (obese).  Young and healthy people can even succumb to heat if they participate in strenuous physical activities during hot weather. CAUSES  Several factors affect the body's ability to cool itself during extremely hot weather. When the humidity is high, sweat will  not evaporate as quickly. This prevents the body from releasing heat quickly. Other factors that can affect the body's ability to cool down include:   Age.  Obesity.  Fever.  Dehydration.  Heart disease.  Mental illness.  Poor circulation.  Sunburn.  Prescription drug use.  Alcohol use. SYMPTOMS  Heat stroke: Warning signs of heat stroke vary, but may include:  An extremely high body temperature (above 103F orally).  A fast, strong pulse.  Dizziness.  Confusion.  Red, hot, and dry skin.  No sweating.  Throbbing headache.  Feeling sick to your stomach (nauseous).  Unconsciousness. Heat exhaustion: Warning signs of heat exhaustion include:  Heavy sweating.  Tiredness.  Headache.  Paleness.  Weakness.  Feeling sick to your stomach (nauseous) or vomiting.  Muscle cramps. Heat cramps  Muscle pains or spasms. TREATMENT  Heat stroke  Get into a cool environment. An indoor place that is air-conditioned may be best.  Take a cool shower or bath. Have someone around to make sure you are okay.  Take your temperature. Make sure it is going down. Heat exhaustion  Drink plenty of fluids. Do not drink liquids that contain caffeine, alcohol, or large amounts of sugar. These cause you to lose more body fluid. Also, avoid very cold drinks. They can cause stomach cramps.  Get into a cool environment. An indoor place that is air-conditioned may be best.  Take a cool shower or bath. Have someone around to make sure you are okay.  Put on lightweight clothing. Heat cramps  Stop whatever activity you were doing. Do not attempt to  do that activity for at least 3 hours after the cramps have gone away.  Get into a cool environment. An indoor place that is air-conditioned may be best. HOME CARE INSTRUCTIONS  To protect your health when temperatures are extremely high, follow these tips:  During heavy exercise in a hot environment, drink two to four glasses  (16-32 ounces) of cool fluids each hour. Do not wait until you are thirsty to drink. Warning: If your caregiver limits the amount of fluid you drink or has you on water pills, ask how much you should drink while the weather is hot.  Do not drink liquids that contain caffeine, alcohol, or large amounts of sugar. These cause you to lose more body fluid.  Avoid very cold drinks. They can cause stomach cramps.  Wear appropriate clothing. Choose lightweight, light-colored, loose-fitting clothing.  If you must be outdoors, try to limit your outdoor activity to morning and evening hours. Try to rest often in shady areas.  If you are not used to working or exercising in a hot environment, start slowly and pick up the pace gradually.  Stay cool in an air-conditioned place if possible. If your home does not have air conditioning, go to the shopping mall or [employer].  Taking a cool shower or bath may help you cool off. SEEK MEDICAL CARE IF:   You see any of the symptoms listed above. You may be dealing with a life-threatening emergency.  Symptoms worsen or last longer than 1 hour.  Heat cramps do not get better in 1 hour. MAKE SURE YOU:   Understand these instructions.  Will watch your condition.  Will get help right away if you are not doing well or get worse. Document Released: 04/30/2008 Document Revised: 10/14/2011 Document Reviewed: 04/30/2008 [facility] Patient Information 2015 [place], [place]. This information is not intended to replace advice given to you by your health care provider. Make sure you discuss any questions you have with your health care provider.

## 2016-03-21 ENCOUNTER — Encounter (HOSPITAL_COMMUNITY): Payer: Self-pay

## 2016-03-21 ENCOUNTER — Emergency Department (HOSPITAL_COMMUNITY)
Admission: EM | Admit: 2016-03-21 | Discharge: 2016-03-22 | Disposition: A | Discharge status: Home / Self Care | Payer: Medicaid Other | Attending: Emergency Medicine | Admitting: Emergency Medicine

## 2016-03-21 DIAGNOSIS — F17210 Nicotine dependence, cigarettes, uncomplicated: Secondary | ICD-10-CM | POA: Insufficient documentation

## 2016-03-21 DIAGNOSIS — Y999 Unspecified external cause status: Secondary | ICD-10-CM | POA: Diagnosis not present

## 2016-03-21 DIAGNOSIS — X58XXXA Exposure to other specified factors, initial encounter: Secondary | ICD-10-CM | POA: Diagnosis not present

## 2016-03-21 DIAGNOSIS — Z79899 Other long term (current) drug therapy: Secondary | ICD-10-CM | POA: Insufficient documentation

## 2016-03-21 DIAGNOSIS — Y939 Activity, unspecified: Secondary | ICD-10-CM | POA: Insufficient documentation

## 2016-03-21 DIAGNOSIS — S39012A Strain of muscle, fascia and tendon of lower back, initial encounter: Secondary | ICD-10-CM | POA: Insufficient documentation

## 2016-03-21 DIAGNOSIS — Y929 Unspecified place or not applicable: Secondary | ICD-10-CM | POA: Insufficient documentation

## 2016-03-21 DIAGNOSIS — S3992XA Unspecified injury of lower back, initial encounter: Secondary | ICD-10-CM | POA: Diagnosis present

## 2016-03-21 MED ORDER — DICLOFENAC SODIUM 75 MG PO TBEC: 75.0000 mg | DELAYED_RELEASE_TABLET | Freq: Two times a day (BID) | ORAL | 0 refills | Status: DC

## 2016-03-21 MED ORDER — METHOCARBAMOL 500 MG PO TABS: 500.0000 mg | ORAL_TABLET | Freq: Three times a day (TID) | ORAL | 0 refills | Status: DC

## 2016-03-21 MED ORDER — METHOCARBAMOL 500 MG PO TABS
500.0000 mg | ORAL_TABLET | Freq: Once | ORAL | Status: AC
  Administered 2016-03-21: 500 mg via ORAL
  Filled 2016-03-21: qty 1

## 2016-03-21 MED ORDER — KETOROLAC TROMETHAMINE 30 MG/ML IJ SOLN
60.0000 mg | Freq: Once | INTRAMUSCULAR | Status: AC
  Administered 2016-03-21: 60 mg via INTRAMUSCULAR
  Filled 2016-03-21: qty 2

## 2016-03-21 MED ORDER — HYDROCODONE-ACETAMINOPHEN 5-325 MG PO TABS: ORAL_TABLET | ORAL | 0 refills | Status: DC

## 2016-03-21 NOTE — ED Provider Notes (Signed)
[facility]-EMERGENCY DEPT Provider Note   CSN: [id number] Arrival date & time: 03/21/16  2217  By signing my name below, I, Phillis HaggisGabriella Gaje, attest that this documentation has been prepared under the direction and in the presence of Tenae Graziosi, PA-C. Electronically Signed: Phillis HaggisGabriella Gaje, ED Scribe. 03/21/16. 10:40 PM.  History   Chief Complaint Chief Complaint  Patient presents with  . Back Pain    The history is provided by the patient. No language interpreter was used.  Back Pain   This is a new problem. The current episode started 3 to 5 hours ago. The problem occurs constantly. The problem has not changed since onset.The pain is associated with no known injury. The pain is present in the lumbar spine. The pain does not radiate. The pain is mild. The symptoms are aggravated by certain positions. Pertinent negatives include no numbness, no abdominal pain, no bowel incontinence, no bladder incontinence and no weakness. He has tried NSAIDs for the symptoms. The treatment provided no relief.  HPI Comments: Benjamin Ingram is a 30 y.o. male who presents to the Emergency Department complaining of gradually worsening, non-radiating lower back pain onset earlier this evening. Pt reports that he will occasionally wake up for work with a "pinched nerve" sensation in either the back or the neck. He states that these symptoms feel the same. He reports worsening pain with standing, stating that he cannot stand straight up. He took Advil at 7:45 PM for his pain to no relief. Pt denies abdominal pain, nausea, vomiting, bladder or bowel incontinence, numbness, or weakness of the extremities.  History reviewed. No pertinent past medical history.  There are no active problems to display for this patient.   History reviewed. No pertinent surgical history.   Home Medications    Prior to Admission medications   Medication Sig Start Date End Date Taking? Authorizing Provider  loperamide (IMODIUM) 2 MG  capsule Take 1 capsule (2 mg total) by mouth 4 (four) times daily as needed for diarrhea or loose stools. 04/04/14   Dione Boozeavid Glick, MD  ondansetron (ZOFRAN) 4 MG tablet Take 1 tablet (4 mg total) by mouth every 6 (six) hours as needed. 04/04/14   Dione Boozeavid Glick, MD    Family History History reviewed. No pertinent family history.  Social History Social History  Substance Use Topics  . Smoking status: Smoker, Current Status Unknown    Packs/day: 1.00    Types: Cigarettes  . Smokeless tobacco: Never Used  . Alcohol use Yes     Comment: rarely     Allergies   Review of patient's allergies indicates no known allergies.   Review of Systems Review of Systems  Gastrointestinal: Negative for abdominal pain, bowel incontinence, nausea and vomiting.  Genitourinary: Negative for bladder incontinence.  Musculoskeletal: Positive for back pain.  Neurological: Negative for weakness and numbness.  All other systems reviewed and are negative.    Physical Exam Updated Vital Signs BP 131/81 (BP Location: Left Arm)   Pulse 76   Temp 98.5 F (36.9 C) (Oral)   Resp 18   Ht 5\' 9"  (1.753 m)   Wt 214 lb (97.1 kg)   SpO2 99%   BMI 31.60 kg/m   Physical Exam  Constitutional: He is oriented to person, place, and time. He appears well-developed and well-nourished. No distress.  HENT:  Head: Normocephalic and atraumatic.  Mouth/Throat: Oropharynx is clear and moist. No oropharyngeal exudate.  Eyes: Conjunctivae and EOM are normal. Pupils are equal, round, and reactive to light.  Neck: Normal range of motion. Neck supple.  Musculoskeletal: Normal range of motion.       Lumbar back: He exhibits tenderness.  Midline tenderness of lower lumbar spine, no bony step-offs, +SLR bilaterally at 15 degrees.   Neurological: He is alert and oriented to person, place, and time.  Skin: Skin is warm and dry.  Psychiatric: He has a normal mood and affect. His behavior is normal.  Nursing note and vitals  reviewed.    ED Treatments / Results  DIAGNOSTIC STUDIES: Oxygen Saturation is 99% on RA, normal by my interpretation.    COORDINATION OF CARE: 10:40 PM-Discussed treatment plan which includes Toradol and muscle relaxant with pt at bedside and pt agreed to plan.    Labs (all labs ordered are listed, but only abnormal results are displayed) Labs Reviewed - No data to display  EKG  EKG Interpretation None       Radiology No results found.  Procedures Procedures (including critical care time)  Medications Ordered in ED Medications  ketorolac (TORADOL) 30 MG/ML injection 60 mg (60 mg Intramuscular Given 03/21/16 2309)  methocarbamol (ROBAXIN) tablet 500 mg (500 mg Oral Given 03/21/16 2309)     Initial Impression / Assessment and Plan / ED Course  I have reviewed the triage vital signs and the nursing notes.  Pertinent labs & imaging results that were available during my care of the patient were reviewed by me and considered in my medical decision making (see chart for details).  Clinical Course    2345 on recheck, pt reports feeling slightly better, now sitting on the stretcher with right leg folded and crossed.  No concerning sx's for emergent neurological process.  Ambulates with a steady gait.  Likely musculoskeletal injury.  Appears stable for d/c, agrees to PMD f/u     Final Clinical Impressions(s) / ED Diagnoses   Final diagnoses:  Lumbar strain, initial encounter   I personally performed the services described in this documentation, which was scribed in my presence. The recorded information has been reviewed and is accurate.  New Prescriptions New Prescriptions   No medications on file     Pauline Ausammy Emile Ringgenberg, Cordelia Poche-C 03/22/16 0001    Marily MemosJason Mesner, MD 03/22/16 0002

## 2016-03-21 NOTE — ED Triage Notes (Signed)
Central lumbar pain that started tonight. Denies injury, denies radiation. Patient ambulatory into triage without deficit.

## 2016-03-21 NOTE — Discharge Instructions (Signed)
Apply ice packs on/off to your back.  Follow-up with Dr. Mort SawyersHarrison's office for recheck or return here for any worsening symptoms

## 2016-03-21 NOTE — ED Notes (Signed)
Pt ambulatory with steady even gait to room.

## 2016-03-21 NOTE — ED Notes (Signed)
Pt states he bends frequently at work and has a hx of "waking up with pinched nerves in my neck or back". Denies dysuria, hematuria, weakness in legs, or trouble ambulating.

## 2016-03-22 NOTE — ED Notes (Signed)
Patient verbalizes understanding of discharge instructions, prescriptions, home care and follow up care. Patient out of department at this time. 

## 2016-04-01 ENCOUNTER — Encounter (HOSPITAL_COMMUNITY): Payer: Self-pay | Admitting: Emergency Medicine

## 2016-04-01 ENCOUNTER — Emergency Department (HOSPITAL_COMMUNITY)
Admission: EM | Admit: 2016-04-01 | Discharge: 2016-04-02 | Disposition: A | Discharge status: Home / Self Care | Payer: Medicaid Other | Attending: Emergency Medicine | Admitting: Emergency Medicine

## 2016-04-01 DIAGNOSIS — G8929 Other chronic pain: Secondary | ICD-10-CM | POA: Insufficient documentation

## 2016-04-01 DIAGNOSIS — M549 Dorsalgia, unspecified: Secondary | ICD-10-CM

## 2016-04-01 DIAGNOSIS — M545 Low back pain: Secondary | ICD-10-CM | POA: Insufficient documentation

## 2016-04-01 DIAGNOSIS — F17210 Nicotine dependence, cigarettes, uncomplicated: Secondary | ICD-10-CM | POA: Insufficient documentation

## 2016-04-01 DIAGNOSIS — Z79899 Other long term (current) drug therapy: Secondary | ICD-10-CM | POA: Insufficient documentation

## 2016-04-01 NOTE — ED Triage Notes (Signed)
Pt c/o chronic lower back flare up and denies any new injury.

## 2016-04-02 MED ORDER — DICLOFENAC SODIUM 75 MG PO TBEC: 75.0000 mg | DELAYED_RELEASE_TABLET | Freq: Two times a day (BID) | ORAL | 0 refills | Status: DC

## 2016-04-02 MED ORDER — CYCLOBENZAPRINE HCL 10 MG PO TABS: 10.0000 mg | ORAL_TABLET | Freq: Three times a day (TID) | ORAL | 0 refills | Status: DC

## 2016-04-02 MED ORDER — KETOROLAC TROMETHAMINE 60 MG/2ML IM SOLN
60.0000 mg | Freq: Once | INTRAMUSCULAR | Status: AC
  Administered 2016-04-02: 60 mg via INTRAMUSCULAR
  Filled 2016-04-02: qty 2

## 2016-04-02 MED ORDER — DEXAMETHASONE 4 MG PO TABS: 4.0000 mg | ORAL_TABLET | Freq: Two times a day (BID) | ORAL | 0 refills | Status: DC

## 2016-04-02 MED ORDER — DEXAMETHASONE SODIUM PHOSPHATE 4 MG/ML IJ SOLN
8.0000 mg | Freq: Once | INTRAMUSCULAR | Status: AC
  Administered 2016-04-02: 8 mg via INTRAMUSCULAR
  Filled 2016-04-02: qty 2

## 2016-04-02 MED ORDER — ONDANSETRON HCL 4 MG PO TABS
4.0000 mg | ORAL_TABLET | Freq: Once | ORAL | Status: AC
  Administered 2016-04-02: 4 mg via ORAL
  Filled 2016-04-02: qty 1

## 2016-04-02 MED FILL — Hydrocodone-Acetaminophen Tab 5-325 MG: ORAL | Qty: 6 | Status: AC

## 2016-04-02 NOTE — ED Provider Notes (Signed)
[facility] Provider Note   CSN: [id number] Arrival date & time: 04/01/16  2318     History   Chief Complaint Chief Complaint  Patient presents with  . Back Pain    HPI Benjamin Ingram is a 30 y.o. male.  The history is provided by the patient.  Back Pain   This is a chronic problem. The current episode started 3 to 5 hours ago. The problem occurs hourly. The problem has been gradually worsening. The pain is present in the lumbar spine. The quality of the pain is described as shooting. The pain does not radiate. The pain is at a severity of 7/10. The pain is moderate. The symptoms are aggravated by bending. The pain is the same all the time. Pertinent negatives include no chest pain, no fever, no numbness, no abdominal pain, no bladder incontinence, no dysuria and no weakness. He has tried nothing for the symptoms.    History reviewed. No pertinent past medical history.  There are no active problems to display for this patient.   History reviewed. No pertinent surgical history.     Home Medications    Prior to Admission medications   Medication Sig Start Date End Date Taking? Authorizing Provider  diclofenac (VOLTAREN) 75 MG EC tablet Take 1 tablet (75 mg total) by mouth 2 (two) times daily. Take with food 03/21/16   Tammy Triplett, PA-C  HYDROcodone-acetaminophen (NORCO/VICODIN) 5-325 MG tablet Take one-two tabs po q 4-6 hrs prn pain 03/21/16   Tammy Triplett, PA-C  loperamide (IMODIUM) 2 MG capsule Take 1 capsule (2 mg total) by mouth 4 (four) times daily as needed for diarrhea or loose stools. 04/04/14   Dione Booze, MD  methocarbamol (ROBAXIN) 500 MG tablet Take 1 tablet (500 mg total) by mouth 3 (three) times daily. 03/21/16   Tammy Triplett, PA-C  ondansetron (ZOFRAN) 4 MG tablet Take 1 tablet (4 mg total) by mouth every 6 (six) hours as needed. 04/04/14   Dione Booze, MD    Family History No family history on file.  Social History Social History  Substance  Use Topics  . Smoking status: Smoker, Current Status Unknown    Packs/day: 1.00    Types: Cigarettes  . Smokeless tobacco: Never Used  . Alcohol use Yes     Comment: rarely     Allergies   Review of patient's allergies indicates no known allergies.   Review of Systems Review of Systems  Constitutional: Negative for activity change and fever.       All ROS Neg except as noted in HPI  HENT: Negative for nosebleeds.   Eyes: Negative for photophobia and discharge.  Respiratory: Negative for cough, shortness of breath and wheezing.   Cardiovascular: Negative for chest pain and palpitations.  Gastrointestinal: Negative for abdominal pain and blood in stool.  Genitourinary: Negative for bladder incontinence, dysuria, frequency and hematuria.  Musculoskeletal: Positive for back pain. Negative for arthralgias and neck pain.  Skin: Negative.   Neurological: Negative for dizziness, seizures, speech difficulty, weakness and numbness.  Psychiatric/Behavioral: Negative for confusion and hallucinations.     Physical Exam Updated Vital Signs BP 143/77   Pulse 89   Temp 98.7 F (37.1 C)   Resp 18   Ht 5\' 8"  (1.727 m)   Wt 100.2 kg   SpO2 98%   BMI 33.60 kg/m   Physical Exam  Constitutional: He is oriented to person, place, and time. He appears well-developed and well-nourished.  Non-toxic appearance.  HENT:  Head:  Normocephalic.  Right Ear: Tympanic membrane and external ear normal.  Left Ear: Tympanic membrane and external ear normal.  Eyes: EOM and lids are normal. Pupils are equal, round, and reactive to light.  Neck: Normal range of motion. Neck supple. Carotid bruit is not present.  Cardiovascular: Normal rate, regular rhythm, normal heart sounds, intact distal pulses and normal pulses.   Pulmonary/Chest: Breath sounds normal. No respiratory distress.  Abdominal: Soft. Bowel sounds are normal. There is no tenderness. There is no guarding.  Musculoskeletal: Normal range of  motion.  Lymphadenopathy:       Head (right side): No submandibular adenopathy present.       Head (left side): No submandibular adenopathy present.    He has no cervical adenopathy.  Neurological: He is alert and oriented to person, place, and time. He has normal strength. No cranial nerve deficit or sensory deficit.  Skin: Skin is warm and dry.  Psychiatric: He has a normal mood and affect. His speech is normal.  Nursing note and vitals reviewed.    ED Treatments / Results  Labs (all labs ordered are listed, but only abnormal results are displayed) Labs Reviewed - No data to display  EKG  EKG Interpretation None       Radiology No results found.  Procedures Procedures (including critical care time)  Medications Ordered in ED Medications - No data to display   Initial Impression / Assessment and Plan / ED Course  I have reviewed the triage vital signs and the nursing notes.  Pertinent labs & imaging results that were available during my care of the patient were reviewed by me and considered in my medical decision making (see chart for details).  Clinical Course    *I have reviewed nursing notes, vital signs, and all appropriate lab and imaging results for this patient.*  Final Clinical Impressions(s) / ED Diagnoses  Vital signs reviewed. There no gross neurologic deficits appreciated. Certainly no signs of caudal equina. I strongly advised the patient to see Dr. Magnus IvanBlackman for orthopedic evaluation concerning his back. He will use a heating pad to the lower back area. Also ordered for him an anti-inflammatory, steroid medication, and muscle relaxing medication. The patient will return to the emergency department if any emergent changes, problems, or concerns.    Final diagnoses:  Chronic back pain    New Prescriptions New Prescriptions   No medications on file     Ivery QualeHobson Novice Vrba, PA-C 04/02/16 0039    Layla MawKristen N Ward, DO 04/02/16 0110

## 2016-04-02 NOTE — Discharge Instructions (Signed)
Please use a heating pad to your lower back. Use Decadron and diclofenac 2 times daily with food. Use Flexeril 3 times daily for spasm. This medication may cause drowsiness. Please do not drink, drive, or participate in activity that requires concentration while taking this medication. Please see Dr. Magnus IvanBlackman for orthopedic evaluation concerning her back pain.

## 2016-05-09 ENCOUNTER — Emergency Department (HOSPITAL_COMMUNITY)
Admission: EM | Admit: 2016-05-09 | Discharge: 2016-05-09 | Disposition: A | Discharge status: Home / Self Care | Payer: Medicaid Other | Attending: Emergency Medicine | Admitting: Emergency Medicine

## 2016-05-09 ENCOUNTER — Encounter (HOSPITAL_COMMUNITY): Payer: Self-pay | Admitting: Emergency Medicine

## 2016-05-09 DIAGNOSIS — J069 Acute upper respiratory infection, unspecified: Secondary | ICD-10-CM

## 2016-05-09 DIAGNOSIS — F17210 Nicotine dependence, cigarettes, uncomplicated: Secondary | ICD-10-CM | POA: Diagnosis not present

## 2016-05-09 DIAGNOSIS — Z792 Long term (current) use of antibiotics: Secondary | ICD-10-CM | POA: Diagnosis not present

## 2016-05-09 DIAGNOSIS — Z79899 Other long term (current) drug therapy: Secondary | ICD-10-CM | POA: Insufficient documentation

## 2016-05-09 DIAGNOSIS — R52 Pain, unspecified: Secondary | ICD-10-CM | POA: Diagnosis present

## 2016-05-09 MED ORDER — AZITHROMYCIN 250 MG PO TABS: ORAL_TABLET | ORAL | 0 refills | Status: DC

## 2016-05-09 NOTE — ED Triage Notes (Signed)
Pt c/o cough with sputum, body aches and fatigue.

## 2016-05-09 NOTE — ED Provider Notes (Signed)
[facility] Provider Note   CSN: [id number] Arrival date & time: 05/09/16  2047     History   Chief Complaint Chief Complaint  Patient presents with  . Generalized Body Aches    HPI Benjamin Ingram is a 30 y.o. male.  Productive cough for 10 days with associated malaise, fatigue, chills. Son was recently diagnosed with pneumonia. Patient is normally healthy.  Nonsmoker.      History reviewed. No pertinent past medical history.  There are no active problems to display for this patient.   History reviewed. No pertinent surgical history.     Home Medications    Prior to Admission medications   Medication Sig Start Date End Date Taking? Authorizing Provider  azithromycin (ZITHROMAX Z-PAK) 250 MG tablet 2 tablets day 1, 1 tablet day 2 through 5 05/09/16   Donnetta Hutching, MD  cyclobenzaprine (FLEXERIL) 10 MG tablet Take 1 tablet (10 mg total) by mouth 3 (three) times daily. 04/02/16   Ivery Quale, PA-C  dexamethasone (DECADRON) 4 MG tablet Take 1 tablet (4 mg total) by mouth 2 (two) times daily with a meal. 04/02/16   Ivery Quale, PA-C  diclofenac (VOLTAREN) 75 MG EC tablet Take 1 tablet (75 mg total) by mouth 2 (two) times daily. 04/02/16   Ivery Quale, PA-C  HYDROcodone-acetaminophen (NORCO/VICODIN) 5-325 MG tablet Take one-two tabs po q 4-6 hrs prn pain 03/21/16   Tammy Triplett, PA-C  loperamide (IMODIUM) 2 MG capsule Take 1 capsule (2 mg total) by mouth 4 (four) times daily as needed for diarrhea or loose stools. 04/04/14   Dione Booze, MD  methocarbamol (ROBAXIN) 500 MG tablet Take 1 tablet (500 mg total) by mouth 3 (three) times daily. 03/21/16   Tammy Triplett, PA-C  ondansetron (ZOFRAN) 4 MG tablet Take 1 tablet (4 mg total) by mouth every 6 (six) hours as needed. 04/04/14   Dione Booze, MD    Family History No family history on file.  Social History Social History  Substance Use Topics  . Smoking status: Smoker, Current Status Unknown    Packs/day: 1.00   Types: Cigarettes  . Smokeless tobacco: Never Used  . Alcohol use Yes     Comment: rarely     Allergies   Review of patient's allergies indicates no known allergies.   Review of Systems Review of Systems  All other systems reviewed and are negative.    Physical Exam Updated Vital Signs BP 166/85 (BP Location: Left Arm)   Pulse 69   Temp 98 F (36.7 C) (Oral)   Resp 18   Ht 5\' 8"  (1.727 m)   Wt 207 lb (93.9 kg)   SpO2 99%   BMI 31.47 kg/m   Physical Exam  Constitutional: He is oriented to person, place, and time. He appears well-developed and well-nourished.  HENT:  Head: Normocephalic and atraumatic.  Eyes: Conjunctivae are normal.  Neck: Neck supple.  Cardiovascular: Normal rate and regular rhythm.   Pulmonary/Chest: Effort normal and breath sounds normal.  Abdominal: Soft. Bowel sounds are normal.  Musculoskeletal: Normal range of motion.  Neurological: He is alert and oriented to person, place, and time.  Skin: Skin is warm and dry.  Psychiatric: He has a normal mood and affect. His behavior is normal.  Nursing note and vitals reviewed.    ED Treatments / Results  Labs (all labs ordered are listed, but only abnormal results are displayed) Labs Reviewed - No data to display  EKG  EKG Interpretation None  Radiology No results found.  Procedures Procedures (including critical care time)  Medications Ordered in ED Medications - No data to display   Initial Impression / Assessment and Plan / ED Course  I have reviewed the triage vital signs and the nursing notes.  Pertinent labs & imaging results that were available during my care of the patient were reviewed by me and considered in my medical decision making (see chart for details).  Clinical Course    Patient is hemodynamically stable. Rx Zithromax secondary to longevity of symptoms.  Final Clinical Impressions(s) / ED Diagnoses   Final diagnoses:  Upper respiratory tract  infection, unspecified type    New Prescriptions Discharge Medication List as of 05/09/2016  9:17 PM    START taking these medications   Details  azithromycin (ZITHROMAX Z-PAK) 250 MG tablet 2 tablets day 1, 1 tablet day 2 through 5, Print         Donnetta HutchingBrian Nevea Spiewak, MD 05/09/16 2219

## 2016-05-09 NOTE — Discharge Instructions (Signed)
Prescription for antibiotic, fluids, gargle salt water, Tylenol or Motrin.

## 2016-06-10 ENCOUNTER — Encounter (HOSPITAL_COMMUNITY): Payer: Self-pay | Admitting: Emergency Medicine

## 2016-06-10 DIAGNOSIS — R0789 Other chest pain: Secondary | ICD-10-CM | POA: Insufficient documentation

## 2016-06-10 DIAGNOSIS — M25511 Pain in right shoulder: Secondary | ICD-10-CM | POA: Diagnosis not present

## 2016-06-10 DIAGNOSIS — Y929 Unspecified place or not applicable: Secondary | ICD-10-CM | POA: Diagnosis not present

## 2016-06-10 DIAGNOSIS — M25551 Pain in right hip: Secondary | ICD-10-CM | POA: Diagnosis not present

## 2016-06-10 DIAGNOSIS — W0110XA Fall on same level from slipping, tripping and stumbling with subsequent striking against unspecified object, initial encounter: Secondary | ICD-10-CM | POA: Insufficient documentation

## 2016-06-10 DIAGNOSIS — Y999 Unspecified external cause status: Secondary | ICD-10-CM | POA: Insufficient documentation

## 2016-06-10 DIAGNOSIS — Y939 Activity, unspecified: Secondary | ICD-10-CM | POA: Insufficient documentation

## 2016-06-10 DIAGNOSIS — S79911A Unspecified injury of right hip, initial encounter: Secondary | ICD-10-CM | POA: Diagnosis present

## 2016-06-10 NOTE — ED Notes (Signed)
Pt went to xray and vomited; pt told Fredric MareBailey RT he felt hot before vomiting started

## 2016-06-10 NOTE — ED Triage Notes (Signed)
Pt tripped over a baby gate and is having pain in right shoulder, right hip and right ribs

## 2016-06-11 ENCOUNTER — Emergency Department (HOSPITAL_COMMUNITY)
Admission: EM | Admit: 2016-06-11 | Discharge: 2016-06-11 | Disposition: A | Discharge status: Home / Self Care | Payer: Medicaid Other | Attending: Emergency Medicine | Admitting: Emergency Medicine

## 2016-06-11 DIAGNOSIS — M25551 Pain in right hip: Secondary | ICD-10-CM

## 2016-06-11 MED ORDER — METHOCARBAMOL 500 MG PO TABS
1000.0000 mg | ORAL_TABLET | Freq: Once | ORAL | Status: AC
  Administered 2016-06-11: 1000 mg via ORAL
  Filled 2016-06-11: qty 2

## 2016-06-11 MED ORDER — METHOCARBAMOL 500 MG PO TABS: 500.0000 mg | ORAL_TABLET | Freq: Two times a day (BID) | ORAL | 0 refills | Status: DC

## 2016-06-11 NOTE — ED Provider Notes (Signed)
[facility] Provider Note   CSN: [id number] Arrival date & time: 06/10/16  2133     History   Chief Complaint Chief Complaint  Patient presents with  . Fall    HPI Benjamin Ingram is a 30 y.o. male.  HPI  30 y.o. male presents to the Emergency Department today complaining of right hip pain s/p mechanical fall yesterday. Notes tripping over baby gate and falling on right hip. Notes striking right shoulder and right chest wall as well. No head trauma. No LOC. Notes pain improved in right shoulder and right ribs, but pt still has right hip pain with ambulation. No pain at rest. Notes ibuprofen with minimal relief. No N/V. No CP/SOB/ABD pain. No numbness/tingling. No other symptoms noted   History reviewed. No pertinent past medical history.  There are no active problems to display for this patient.   History reviewed. No pertinent surgical history.   Home Medications    Prior to Admission medications   Medication Sig Start Date End Date Taking? Authorizing Provider  azithromycin (ZITHROMAX Z-PAK) 250 MG tablet 2 tablets day 1, 1 tablet day 2 through 5 05/09/16   Donnetta HutchingBrian Cook, MD  cyclobenzaprine (FLEXERIL) 10 MG tablet Take 1 tablet (10 mg total) by mouth 3 (three) times daily. 04/02/16   Ivery QualeHobson Bryant, PA-C  dexamethasone (DECADRON) 4 MG tablet Take 1 tablet (4 mg total) by mouth 2 (two) times daily with a meal. 04/02/16   Ivery QualeHobson Bryant, PA-C  diclofenac (VOLTAREN) 75 MG EC tablet Take 1 tablet (75 mg total) by mouth 2 (two) times daily. 04/02/16   Ivery QualeHobson Bryant, PA-C  HYDROcodone-acetaminophen (NORCO/VICODIN) 5-325 MG tablet Take one-two tabs po q 4-6 hrs prn pain 03/21/16   Tammy Triplett, PA-C  loperamide (IMODIUM) 2 MG capsule Take 1 capsule (2 mg total) by mouth 4 (four) times daily as needed for diarrhea or loose stools. 04/04/14   Dione Boozeavid Glick, MD  methocarbamol (ROBAXIN) 500 MG tablet Take 1 tablet (500 mg total) by mouth 2 (two) times daily. 06/11/16   Audry Piliyler Dorena Dorfman, PA-C    ondansetron (ZOFRAN) 4 MG tablet Take 1 tablet (4 mg total) by mouth every 6 (six) hours as needed. 04/04/14   Dione Boozeavid Glick, MD    Family History History reviewed. No pertinent family history.  Social History Social History  Substance Use Topics  . Smoking status: Smoker, Current Status Unknown    Packs/day: 1.00    Types: Cigarettes  . Smokeless tobacco: Never Used  . Alcohol use Yes     Comment: rarely     Allergies   Patient has no known allergies.   Review of Systems Review of Systems ROS reviewed and all are negative for acute change except as noted in the HPI.  Physical Exam Updated Vital Signs BP 140/84 (BP Location: Left Arm)   Pulse 72   Temp 98.4 F (36.9 C) (Oral)   Resp 16   Ht 5\' 8"  (1.727 m)   Wt 99.8 kg   SpO2 99%   BMI 33.45 kg/m   Physical Exam  Constitutional: He is oriented to person, place, and time. Vital signs are normal. He appears well-developed and well-nourished.  HENT:  Head: Normocephalic and atraumatic.  Right Ear: Hearing normal.  Left Ear: Hearing normal.  Eyes: Conjunctivae and EOM are normal. Pupils are equal, round, and reactive to light.  Neck: Normal range of motion. Neck supple.  Cardiovascular: Normal rate, regular rhythm, normal heart sounds and intact distal pulses.   Pulmonary/Chest: Effort  normal and breath sounds normal. He exhibits no tenderness.  Musculoskeletal: Normal range of motion.  Right Hip Internal and External Rotation without pain. NVI. Motor/sensation intact. TTP along lateral aspect of right hip. No visible or palpable deformities on exam.   Neurological: He is alert and oriented to person, place, and time.  Skin: Skin is warm and dry.  Psychiatric: He has a normal mood and affect. His speech is normal and behavior is normal. Thought content normal.  Nursing note and vitals reviewed.  ED Treatments / Results  Labs (all labs ordered are listed, but only abnormal results are displayed) Labs Reviewed -  No data to display  EKG  EKG Interpretation None       Radiology No results found.  Procedures Procedures (including critical care time)  Medications Ordered in ED Medications  methocarbamol (ROBAXIN) tablet 1,000 mg (not administered)   Initial Impression / Assessment and Plan / ED Course  I have reviewed the triage vital signs and the nursing notes.  Pertinent labs & imaging results that were available during my care of the patient were reviewed by me and considered in my medical decision making (see chart for details).  Clinical Course     Final Clinical Impressions(s) / ED Diagnoses  I have reviewed the relevant previous healthcare records. I obtained HPI from historian.  ED Course:  Assessment: Pt is a 30yM who presents with right hip pain s/p mechanical fall yesterday. Notes pain in shoulder and ribs have all but gone away. On exam, pt in NAD. Nontoxic/nonseptic appearing. VSS. Afebrile. Lungs CTA. Heart RRR. Abdomen nontender soft. Right hip with internal/external rotation intact without pain. TTP on lateral aspect. No palpable or visible deformities on exam. NVI. Motor/senastion intact. No indication for further imaging. Likely bruise on hip. Pt ambulates well without gait abnormalities. Given robaxin in ED. Plan is to DC home with robaxin and follow up to PCP. At time of discharge, Patient is in no acute distress. Vital Signs are stable. Patient is able to ambulate. Patient able to tolerate PO.    Disposition/Plan:  DC Home Additional Verbal discharge instructions given and discussed with patient.  Pt Instructed to f/u with PCP in the next week for evaluation and treatment of symptoms. Return precautions given Pt acknowledges and agrees with plan  Supervising Physician Gilda Creasehristopher J Pollina, MD   Final diagnoses:  Right hip pain    New Prescriptions New Prescriptions   METHOCARBAMOL (ROBAXIN) 500 MG TABLET    Take 1 tablet (500 mg total) by mouth 2 (two)  times daily.     Audry Piliyler Yashika Mask, PA-C 06/11/16 0028    Gilda Creasehristopher J Pollina, MD 06/11/16 [phone]

## 2016-06-11 NOTE — Discharge Instructions (Signed)
Please read and follow all provided instructions.  Your diagnoses today include:  1. Right hip pain     Tests performed today include: Vital signs. See below for your results today.   Medications prescribed:  Take as prescribed   You can use Ibuprofen 400mg  combined with Tylenol 1000mg  for pain relief every 6 hours. Do not exceed 4g of Tylenol in one 24 hour period. Do not exceed 10 days of this regiment.  Home care instructions:  Follow any educational materials contained in this packet.  Follow-up instructions: Please follow-up with your primary care provider for further evaluation of symptoms and treatment   Return instructions:  Please return to the Emergency Department if you do not get better, if you get worse, or new symptoms OR  - Fever (temperature greater than 101.71F)  - Bleeding that does not stop with holding pressure to the area    -Severe pain (please note that you may be more sore the day after your accident)  - Chest Pain  - Difficulty breathing  - Severe nausea or vomiting  - Inability to tolerate food and liquids  - Passing out  - Skin becoming red around your wounds  - Change in mental status (confusion or lethargy)  - New numbness or weakness    Please return if you have any other emergent concerns.  Additional Information:  Your vital signs today were: BP 140/84 (BP Location: Left Arm)    Pulse 72    Temp 98.4 F (36.9 C) (Oral)    Resp 16    Ht 5\' 8"  (1.727 m)    Wt 99.8 kg    SpO2 99%    BMI 33.45 kg/m  If your blood pressure (BP) was elevated above 135/85 this visit, please have this repeated by your doctor within one month. ---------------

## 2016-06-11 NOTE — ED Notes (Signed)
Pt ambulated without difficulty from triage.

## 2017-01-25 ENCOUNTER — Encounter (HOSPITAL_COMMUNITY): Payer: Self-pay | Admitting: Emergency Medicine

## 2017-01-25 ENCOUNTER — Emergency Department (HOSPITAL_COMMUNITY)
Admission: EM | Admit: 2017-01-25 | Discharge: 2017-01-25 | Disposition: A | Discharge status: Home / Self Care | Payer: Medicaid Other | Attending: Emergency Medicine | Admitting: Emergency Medicine

## 2017-01-25 DIAGNOSIS — H81399 Other peripheral vertigo, unspecified ear: Secondary | ICD-10-CM | POA: Insufficient documentation

## 2017-01-25 DIAGNOSIS — R112 Nausea with vomiting, unspecified: Secondary | ICD-10-CM

## 2017-01-25 DIAGNOSIS — R197 Diarrhea, unspecified: Secondary | ICD-10-CM | POA: Diagnosis not present

## 2017-01-25 DIAGNOSIS — F17210 Nicotine dependence, cigarettes, uncomplicated: Secondary | ICD-10-CM | POA: Insufficient documentation

## 2017-01-25 HISTORY — DX: Headache, unspecified: R51.9

## 2017-01-25 HISTORY — DX: Headache: R51

## 2017-01-25 HISTORY — DX: Dizziness and giddiness: R42

## 2017-01-25 LAB — CBC
HEMATOCRIT: 46.7 % (ref 39.0–52.0)
Hemoglobin: 15.9 g/dL (ref 13.0–17.0)
MCH: 29.1 pg (ref 26.0–34.0)
MCHC: 34 g/dL (ref 30.0–36.0)
MCV: 85.5 fL (ref 78.0–100.0)
Platelets: 370 10*3/uL (ref 150–400)
RBC: 5.46 MIL/uL (ref 4.22–5.81)
RDW: 12.7 % (ref 11.5–15.5)
WBC: 9.1 10*3/uL (ref 4.0–10.5)

## 2017-01-25 LAB — DIFFERENTIAL
Basophils Absolute: 0.1 10*3/uL (ref 0.0–0.1)
Basophils Relative: 1 %
EOS PCT: 1 %
Eosinophils Absolute: 0.1 10*3/uL (ref 0.0–0.7)
LYMPHS ABS: 2.9 10*3/uL (ref 0.7–4.0)
LYMPHS PCT: 31 %
Monocytes Absolute: 0.7 10*3/uL (ref 0.1–1.0)
Monocytes Relative: 7 %
NEUTROS PCT: 60 %
Neutro Abs: 5.6 10*3/uL (ref 1.7–7.7)

## 2017-01-25 LAB — COMPREHENSIVE METABOLIC PANEL
ALBUMIN: 4.7 g/dL (ref 3.5–5.0)
ALT: 32 U/L (ref 17–63)
AST: 24 U/L (ref 15–41)
Alkaline Phosphatase: 114 U/L (ref 38–126)
Anion gap: 10 (ref 5–15)
BUN: 10 mg/dL (ref 6–20)
CHLORIDE: 101 mmol/L (ref 101–111)
CO2: 26 mmol/L (ref 22–32)
Calcium: 9.4 mg/dL (ref 8.9–10.3)
Creatinine, Ser: 1.03 mg/dL (ref 0.61–1.24)
GFR calc Af Amer: >60 mL/min (ref >60)
GFR calc non Af Amer: >60 mL/min (ref >60)
GLUCOSE: 108 mg/dL — AB (ref 65–99)
POTASSIUM: 3.7 mmol/L (ref 3.5–5.1)
Sodium: 137 mmol/L (ref 135–145)
Total Bilirubin: 0.7 mg/dL (ref 0.3–1.2)
Total Protein: 8 g/dL (ref 6.5–8.1)

## 2017-01-25 LAB — LIPASE, BLOOD: LIPASE: 27 U/L (ref 11–51)

## 2017-01-25 MED ORDER — SODIUM CHLORIDE 0.9 % IV BOLUS (SEPSIS)
1000.0000 mL | Freq: Once | INTRAVENOUS | Status: AC
  Administered 2017-01-25: 1000 mL via INTRAVENOUS

## 2017-01-25 MED ORDER — PROMETHAZINE HCL 25 MG PO TABS: 25.0000 mg | ORAL_TABLET | Freq: Four times a day (QID) | ORAL | 0 refills | Status: AC | PRN

## 2017-01-25 MED ORDER — PROMETHAZINE HCL 25 MG/ML IJ SOLN
12.5000 mg | Freq: Once | INTRAMUSCULAR | Status: AC
  Administered 2017-01-25: 12.5 mg via INTRAVENOUS
  Filled 2017-01-25: qty 1

## 2017-01-25 NOTE — ED Triage Notes (Signed)
PT states vomiting x3 and diarrhea x2 that started today at 1300 but denies any abdominal pain or urinary symptoms.

## 2017-01-25 NOTE — Discharge Instructions (Signed)
Take the prescription as directed.  Increase your fluid intake (ie:  Gatoraide) for the next few days.  Eat a bland diet and advance to your regular diet slowly as you can tolerate it.   Avoid full strength juices, as well as milk and milk products until your diarrhea has resolved.   Call your regular medical doctor Monday to schedule a follow up appointment in the next 3 days.  Return to the Emergency Department immediately if not improving (or even worsening) despite taking the medicines as prescribed, any black or bloody stool or vomit, if you develop a fever over "101," or for any other concerns.

## 2017-01-25 NOTE — ED Provider Notes (Signed)
[facility]-EMERGENCY DEPT Provider Note   CSN: [id number] Arrival date & time: 01/25/17  1559     History   Chief Complaint Chief Complaint  Patient presents with  . Emesis    HPI Benjamin Ingram is a 31 y.o. male.   Emesis      Pt was seen at 1655. Per pt, c/o gradual onset and persistence of multiple intermittent episodes of N/V/D that began several hours ago.   Describes the stools as "watery."  Has been associated with acute flair of his "vertigo." Pt describes his vertigo as "a tilting sensation." Denies abd pain, no CP/SOB, no back pain, no dysuria/hematuria, no fevers, no black or blood in stools or emesis, no headache, no focal motor weakness, no tingling/numbness in extremities.    Past Medical History:  Diagnosis Date  . Headache   . Vertigo     There are no active problems to display for this patient.   History reviewed. No pertinent surgical history.     Home Medications    Prior to Admission medications   Medication Sig Start Date End Date Taking? Authorizing Provider  azithromycin (ZITHROMAX Z-PAK) 250 MG tablet 2 tablets day 1, 1 tablet day 2 through 5 05/09/16   Donnetta Hutchingook, Brian, MD  cyclobenzaprine (FLEXERIL) 10 MG tablet Take 1 tablet (10 mg total) by mouth 3 (three) times daily. 04/02/16   Ivery QualeBryant, Hobson, PA-C  dexamethasone (DECADRON) 4 MG tablet Take 1 tablet (4 mg total) by mouth 2 (two) times daily with a meal. 04/02/16   Ivery QualeBryant, Hobson, PA-C  diclofenac (VOLTAREN) 75 MG EC tablet Take 1 tablet (75 mg total) by mouth 2 (two) times daily. 04/02/16   Ivery QualeBryant, Hobson, PA-C  HYDROcodone-acetaminophen (NORCO/VICODIN) 5-325 MG tablet Take one-two tabs po q 4-6 hrs prn pain 03/21/16   Triplett, Tammy, PA-C  loperamide (IMODIUM) 2 MG capsule Take 1 capsule (2 mg total) by mouth 4 (four) times daily as needed for diarrhea or loose stools. 04/04/14   Dione BoozeGlick, David, MD  methocarbamol (ROBAXIN) 500 MG tablet Take 1 tablet (500 mg total) by mouth 2 (two) times daily. 06/11/16    Audry PiliMohr, Tyler, PA-C  ondansetron (ZOFRAN) 4 MG tablet Take 1 tablet (4 mg total) by mouth every 6 (six) hours as needed. 04/04/14   Dione BoozeGlick, David, MD    Family History History reviewed. No pertinent family history.  Social History Social History  Substance Use Topics  . Smoking status: Smoker, Current Status Unknown    Packs/day: 0.50    Types: Cigarettes  . Smokeless tobacco: Never Used  . Alcohol use Yes     Comment: rarely     Allergies   Pineapple   Review of Systems Review of Systems  Gastrointestinal: Positive for vomiting.  ROS: Statement: All systems negative except as marked or noted in the HPI; Constitutional: Negative for fever and chills. ; ; Eyes: Negative for eye pain, redness and discharge. ; ; ENMT: Negative for ear pain, hoarseness, nasal congestion, sinus pressure and sore throat. ; ; Cardiovascular: Negative for chest pain, palpitations, diaphoresis, dyspnea and peripheral edema. ; ; Respiratory: Negative for cough, wheezing and stridor. ; ; Gastrointestinal: +N/V/D. Negative for abdominal pain, blood in stool, hematemesis, jaundice and rectal bleeding. . ; ; Genitourinary: Negative for dysuria, flank pain and hematuria. ; ; Musculoskeletal: Negative for back pain and neck pain. Negative for swelling and trauma.; ; Skin: Negative for pruritus, rash, abrasions, blisters, bruising and skin lesion.; ; Neuro: +"vertigo." Negative for headache, lightheadedness and neck  stiffness. Negative for weakness, altered level of consciousness, altered mental status, extremity weakness, paresthesias, involuntary movement, seizure and syncope.      Physical Exam Updated Vital Signs BP 127/86   Pulse 77   Temp 98 F (36.7 C) (Oral)   Resp 18   Ht 5\' 7"  (1.702 m)   Wt 99.8 kg (220 lb)   SpO2 95%   BMI 34.46 kg/m   Physical Exam 1700: Physical examination:  Nursing notes reviewed; Vital signs and O2 SAT reviewed;  Constitutional: Well developed, Well nourished, Well hydrated,  In no acute distress; Head:  Normocephalic, atraumatic; Eyes: EOMI, PERRL, No scleral icterus; ENMT: TM's clear bilat. Mouth and pharynx normal, Mucous membranes moist; Neck: Supple, Full range of motion, No lymphadenopathy; Cardiovascular: Regular rate and rhythm, No gallop; Respiratory: Breath sounds clear & equal bilaterally, No wheezes.  Speaking full sentences with ease, Normal respiratory effort/excursion; Chest: Nontender, Movement normal; Abdomen: Soft, Nontender, Nondistended, Normal bowel sounds; Genitourinary: No CVA tenderness; Extremities: Pulses normal, No tenderness, No edema, No calf edema or asymmetry.; Neuro: AA&Ox3, Major CN grossly intact. No facial droop. +right horizontal end gaze fatigable nystagmus. Speech clear. No gross focal motor or sensory deficits in extremities.; Skin: Color normal, Warm, Dry.   ED Treatments / Results  Labs (all labs ordered are listed, but only abnormal results are displayed)   EKG  EKG Interpretation None       Radiology   Procedures Procedures (including critical care time)  Medications Ordered in ED Medications  sodium chloride 0.9 % bolus 1,000 mL (not administered)  promethazine (PHENERGAN) injection 12.5 mg (not administered)     Initial Impression / Assessment and Plan / ED Course  I have reviewed the triage vital signs and the nursing notes.  Pertinent labs & imaging results that were available during my care of the patient were reviewed by me and considered in my medical decision making (see chart for details).  MDM Reviewed: previous chart, nursing note and vitals Reviewed previous: labs Interpretation: labs   Results for orders placed or performed during the hospital encounter of 01/25/17  Lipase, blood  Result Value Ref Range   Lipase 27 11 - 51 U/L  Comprehensive metabolic panel  Result Value Ref Range   Sodium 137 135 - 145 mmol/L   Potassium 3.7 3.5 - 5.1 mmol/L   Chloride 101 101 - 111 mmol/L   CO2 26 22  - 32 mmol/L   Glucose, Bld 108 (H) 65 - 99 mg/dL   BUN 10 6 - 20 mg/dL   Creatinine, Ser 1.61 0.61 - 1.24 mg/dL   Calcium 9.4 8.9 - 09.6 mg/dL   Total Protein 8.0 6.5 - 8.1 g/dL   Albumin 4.7 3.5 - 5.0 g/dL   AST 24 15 - 41 U/L   ALT 32 17 - 63 U/L   Alkaline Phosphatase 114 38 - 126 U/L   Total Bilirubin 0.7 0.3 - 1.2 mg/dL   GFR calc non Af Amer >60 >60 mL/min   GFR calc Af Amer >60 >60 mL/min   Anion gap 10 5 - 15  CBC  Result Value Ref Range   WBC 9.1 4.0 - 10.5 K/uL   RBC 5.46 4.22 - 5.81 MIL/uL   Hemoglobin 15.9 13.0 - 17.0 g/dL   HCT 04.5 40.9 - 81.1 %   MCV 85.5 78.0 - 100.0 fL   MCH 29.1 26.0 - 34.0 pg   MCHC 34.0 30.0 - 36.0 g/dL   RDW 91.4 78.2 -  15.5 %   Platelets 370 150 - 400 K/uL  Differential  Result Value Ref Range   Neutrophils Relative % 60 %   Neutro Abs 5.6 1.7 - 7.7 K/uL   Lymphocytes Relative 31 %   Lymphs Abs 2.9 0.7 - 4.0 K/uL   Monocytes Relative 7 %   Monocytes Absolute 0.7 0.1 - 1.0 K/uL   Eosinophils Relative 1 %   Eosinophils Absolute 0.1 0.0 - 0.7 K/uL   Basophils Relative 1 %   Basophils Absolute 0.1 0.0 - 0.1 K/uL    1910:  Pt has tol PO well while in the ED without N/V.  No stooling while in the ED.  Pt has ambulated with steady gait, easy resps, NAD. Denies further vertigo symptoms. Abd remains benign, VSS. Feels better and wants to go home now. Tx symptomatically at this time. Dx and testing d/w pt.  Questions answered.  Verb understanding, agreeable to d/c home with outpt f/u.     Final Clinical Impressions(s) / ED Diagnoses   Final diagnoses:  None    New Prescriptions New Prescriptions   No medications on file      Samuel Jester, DO 01/28/17 1610

## 2017-01-25 NOTE — ED Notes (Signed)
Pt ambulated with assistance. No complaints. Pt tolerated fluids well.

## 2018-03-30 ENCOUNTER — Emergency Department (HOSPITAL_COMMUNITY)
Admission: EM | Admit: 2018-03-30 | Discharge: 2018-03-30 | Disposition: A | Discharge status: Home / Self Care | Payer: Medicaid Other | Attending: Emergency Medicine | Admitting: Emergency Medicine

## 2018-03-30 ENCOUNTER — Encounter (HOSPITAL_COMMUNITY): Payer: Self-pay | Admitting: Emergency Medicine

## 2018-03-30 ENCOUNTER — Other Ambulatory Visit: Payer: Self-pay

## 2018-03-30 DIAGNOSIS — M545 Low back pain, unspecified: Secondary | ICD-10-CM

## 2018-03-30 DIAGNOSIS — F17210 Nicotine dependence, cigarettes, uncomplicated: Secondary | ICD-10-CM | POA: Insufficient documentation

## 2018-03-30 NOTE — ED Triage Notes (Signed)
Low Back pain for a couple days.  Pt states his job wants him examined before coming back to work.  Denies any injury says he has history of these flare ups.

## 2018-03-30 NOTE — Discharge Instructions (Signed)
Return if any problems.

## 2018-03-30 NOTE — ED Provider Notes (Signed)
[facility] EMERGENCY DEPARTMENT Provider Note   CSN: [id number] Arrival date & time: 03/30/18  1027     History   Chief Complaint Chief Complaint  Patient presents with  . Back Pain    HPI Benjamin Ingram is a 32 y.o. male.  The history is provided by the patient. No language interpreter was used.  Back Pain   This is a recurrent problem. The current episode started 2 days ago. The problem occurs constantly. The problem has been resolved. The pain is associated with lifting heavy objects. The pain is present in the lumbar spine. The pain is the same all the time. He has tried nothing for the symptoms. The treatment provided no relief.  Pt has occasional back pain.  Pt states he missed work because of pain and needs a note to return.  Pt reports no pain now.  Past Medical History:  Diagnosis Date  . Headache   . Vertigo     There are no active problems to display for this patient.   History reviewed. No pertinent surgical history.      Home Medications    Prior to Admission medications   Medication Sig Start Date End Date Taking? Authorizing Provider  promethazine (PHENERGAN) 25 MG tablet Take 1 tablet (25 mg total) by mouth every 6 (six) hours as needed for nausea or vomiting (or dizziness/vertigo). 01/25/17   Samuel Jester, DO    Family History No family history on file.  Social History Social History   Tobacco Use  . Smoking status: Smoker, Current Status Unknown    Packs/day: 0.50    Types: Cigarettes  . Smokeless tobacco: Never Used  Substance Use Topics  . Alcohol use: Yes    Comment: rarely  . Drug use: No     Allergies   Pineapple   Review of Systems Review of Systems  Musculoskeletal: Positive for back pain.  All other systems reviewed and are negative.    Physical Exam Updated Vital Signs BP (!) 152/95 (BP Location: Right Arm)   Pulse 75   Temp 98.4 F (36.9 C) (Oral)   Resp 18   Ht 5\' 6"  (1.676 m)   Wt 105.2 kg   SpO2 98%    BMI 37.45 kg/m   Physical Exam  Constitutional: He appears well-developed and well-nourished.  HENT:  Head: Normocephalic and atraumatic.  Eyes: Conjunctivae are normal.  Neck: Neck supple.  Cardiovascular: Normal rate and regular rhythm.  No murmur heard. Pulmonary/Chest: Effort normal and breath sounds normal. No respiratory distress.  Abdominal: Soft. There is no tenderness.  Musculoskeletal: Normal range of motion. He exhibits no edema or tenderness.  Neurological: He is alert.  Skin: Skin is warm and dry.  Psychiatric: He has a normal mood and affect.  Nursing note and vitals reviewed.    ED Treatments / Results  Labs (all labs ordered are listed, but only abnormal results are displayed) Labs Reviewed - No data to display  EKG None  Radiology No results found.  Procedures Procedures (including critical care time)  Medications Ordered in ED Medications - No data to display   Initial Impression / Assessment and Plan / ED Course  I have reviewed the triage vital signs and the nursing notes.  Pertinent labs & imaging results that were available during my care of the patient were reviewed by me and considered in my medical decision making (see chart for details).     Normal exam  Pt counseled on lifting.  I  advised trying a back support brace  Final Clinical Impressions(s) / ED Diagnoses   Final diagnoses:  Acute midline low back pain without sciatica    ED Discharge Orders    None    An After Visit Summary was printed and given to the patient.    Elson AreasSofia, Leslie K, [place] JerseyPA-C 03/30/18 1142    Loren RacerYelverton, David, MD 03/30/18 [id number]

## 2018-12-20 ENCOUNTER — Encounter (HOSPITAL_COMMUNITY): Payer: Self-pay | Admitting: Emergency Medicine

## 2018-12-20 ENCOUNTER — Emergency Department (HOSPITAL_COMMUNITY)
Admission: EM | Admit: 2018-12-20 | Discharge: 2018-12-20 | Disposition: A | Discharge status: Home / Self Care | Payer: Medicaid Other | Attending: Emergency Medicine | Admitting: Emergency Medicine

## 2018-12-20 ENCOUNTER — Emergency Department (HOSPITAL_COMMUNITY): Payer: Medicaid Other

## 2018-12-20 ENCOUNTER — Other Ambulatory Visit: Payer: Self-pay

## 2018-12-20 DIAGNOSIS — Y939 Activity, unspecified: Secondary | ICD-10-CM | POA: Insufficient documentation

## 2018-12-20 DIAGNOSIS — Y929 Unspecified place or not applicable: Secondary | ICD-10-CM | POA: Diagnosis not present

## 2018-12-20 DIAGNOSIS — W51XXXA Accidental striking against or bumped into by another person, initial encounter: Secondary | ICD-10-CM | POA: Diagnosis not present

## 2018-12-20 DIAGNOSIS — Y999 Unspecified external cause status: Secondary | ICD-10-CM | POA: Insufficient documentation

## 2018-12-20 DIAGNOSIS — S6991XA Unspecified injury of right wrist, hand and finger(s), initial encounter: Secondary | ICD-10-CM | POA: Insufficient documentation

## 2018-12-20 DIAGNOSIS — S60944A Unspecified superficial injury of right ring finger, initial encounter: Secondary | ICD-10-CM | POA: Diagnosis not present

## 2018-12-20 DIAGNOSIS — F17290 Nicotine dependence, other tobacco product, uncomplicated: Secondary | ICD-10-CM | POA: Diagnosis not present

## 2018-12-20 DIAGNOSIS — Z79899 Other long term (current) drug therapy: Secondary | ICD-10-CM | POA: Diagnosis not present
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
## 2018-12-20 NOTE — Discharge Instructions (Signed)
You were seen in the ED today for a finger injury; your xray was negative for a break or dislocation. Please follow up with your PCP; if you do not have one you can follow up at the [facility] clinic as well. You can take Ibuprofen and Tylenol as needed for pain and apply ice to the area. Wear the splint until the pain improves and the swelling goes down.

## 2019-05-18 DIAGNOSIS — B349 Viral infection, unspecified: Secondary | ICD-10-CM | POA: Diagnosis not present

## 2019-05-18 DIAGNOSIS — K529 Noninfective gastroenteritis and colitis, unspecified: Secondary | ICD-10-CM | POA: Diagnosis not present

## 2019-05-28 DIAGNOSIS — M545 Low back pain: Secondary | ICD-10-CM | POA: Diagnosis not present

## 2019-06-21 ENCOUNTER — Other Ambulatory Visit: Payer: Self-pay

## 2019-06-21 ENCOUNTER — Emergency Department (HOSPITAL_COMMUNITY)
Admission: EM | Admit: 2019-06-21 | Discharge: 2019-06-21 | Disposition: A | Discharge status: Home / Self Care | Payer: Medicaid Other | Attending: Emergency Medicine | Admitting: Emergency Medicine

## 2019-06-21 ENCOUNTER — Encounter (HOSPITAL_COMMUNITY): Payer: Self-pay

## 2019-06-21 DIAGNOSIS — M542 Cervicalgia: Secondary | ICD-10-CM | POA: Insufficient documentation

## 2019-06-21 DIAGNOSIS — R519 Headache, unspecified: Secondary | ICD-10-CM | POA: Insufficient documentation

## 2019-06-21 MED ORDER — NAPROXEN 500 MG PO TABS: 500.0000 mg | ORAL_TABLET | Freq: Two times a day (BID) | ORAL | 0 refills | Status: AC

## 2019-06-21 MED ORDER — METHOCARBAMOL 500 MG PO TABS: 500.0000 mg | ORAL_TABLET | Freq: Two times a day (BID) | ORAL | 0 refills | Status: AC

## 2019-06-21 MED ORDER — PREDNISONE 50 MG PO TABS
60.0000 mg | ORAL_TABLET | Freq: Once | ORAL | Status: AC
  Administered 2019-06-21: 09:00:00 60 mg via ORAL
  Filled 2019-06-21: qty 1

## 2019-06-21 NOTE — ED Triage Notes (Signed)
Pt reports woke up with pain in left side of neck and dull headache.  Denies fever.

## 2019-06-21 NOTE — Discharge Instructions (Addendum)
I have prescribed muscle relaxers for your pain, please do not drink or drive while taking this medications as they can make you drowsy.    Please follow-up with PCP in 1 week for reevaluation of your symptoms.    If you experience any fever, blurry vision, worsening headache please return to the emergency department.

## 2019-06-21 NOTE — ED Provider Notes (Signed)
[facility] EMERGENCY DEPARTMENT Provider Note   CSN: [id number] Arrival date & time: 06/21/19  [id number]     History   Chief Complaint Chief Complaint  Patient presents with  . Neck Pain    HPI Benjamin Ingram is a 33 y.o. male.     33 y.o male with a PMH of Headache resents to the ED with a chief complaint of neck pain since this morning.  Patient reports waking up and having a pain located to the left side of his neck, this is worse with movement of his neck.  He does report a similar episode in the past, this was caused by him sleeping on the wrong side.  He reports taking ibuprofen this morning for improvement in symptoms however was unable to get any relief.  He does report going to work today and this making the pain worse as he had to take his neck to perform his job.  He also endorses a dull headache, this is very mild in nature as he reports ibuprofen has somewhat help with that.  He has been seen in the past for "pinched nerve issues ", states he usually gets an anti-inflammatory and a muscle relaxer which improves his pain.  He reports no fevers, blurred vision, tingling to his ends or weakness.   Neck Pain Associated symptoms: headaches   Associated symptoms: no fever     Past Medical History:  Diagnosis Date  . Headache   . Vertigo     There are no active problems to display for this patient.   History reviewed. No pertinent surgical history.      Home Medications    Prior to Admission medications   Medication Sig Start Date End Date Taking? Authorizing Provider  methocarbamol (ROBAXIN) 500 MG tablet Take 1 tablet (500 mg total) by mouth 2 (two) times daily for 7 days. 06/21/19 06/28/19  Claude MangesSoto, Regis Wiland, PA-C  naproxen (NAPROSYN) 500 MG tablet Take 1 tablet (500 mg total) by mouth 2 (two) times daily for 7 days. 06/21/19 06/28/19  Claude MangesSoto, Harper Vandervoort, PA-C  promethazine (PHENERGAN) 25 MG tablet Take 1 tablet (25 mg total) by mouth every 6 (six) hours as needed for  nausea or vomiting (or dizziness/vertigo). 01/25/17   Samuel JesterMcManus, Kathleen, DO    Family History No family history on file.  Social History Social History   Tobacco Use  . Smoking status: Smoker, Current Status Unknown    Packs/day: 0.50    Types: E-cigarettes  . Smokeless tobacco: Never Used  Substance Use Topics  . Alcohol use: Never    Frequency: Never    Comment: rarely  . Drug use: No     Allergies   Pineapple   Review of Systems Review of Systems  Constitutional: Negative for fever.  Musculoskeletal: Positive for neck pain.  Neurological: Positive for headaches. Negative for light-headedness.     Physical Exam Updated Vital Signs BP (!) 139/98 (BP Location: Left Arm)   Pulse 77   Temp 98.2 F (36.8 C) (Oral)   Resp 18   Ht 5\' 8"  (1.727 m)   Wt 87.1 kg   SpO2 100%   BMI 29.19 kg/m   Physical Exam Vitals signs and nursing note reviewed.  Constitutional:      Appearance: He is well-developed.  HENT:     Head: Normocephalic and atraumatic.  Eyes:     General: No scleral icterus.    Pupils: Pupils are equal, round, and reactive to light.  Neck:  Musculoskeletal: Normal range of motion.     Trachea: Trachea normal.     Meningeal: Brudzinski's sign and Kernig's sign absent.   Cardiovascular:     Heart sounds: Normal heart sounds.  Pulmonary:     Effort: Pulmonary effort is normal.     Breath sounds: Normal breath sounds. No wheezing.  Chest:     Chest wall: No tenderness.  Abdominal:     General: Bowel sounds are normal. There is no distension.     Palpations: Abdomen is soft.     Tenderness: There is no abdominal tenderness.  Musculoskeletal:        General: No tenderness or deformity.  Skin:    General: Skin is warm and dry.  Neurological:     Mental Status: He is alert and oriented to person, place, and time.     Comments: Alert, oriented, thought content appropriate. Speech fluent without evidence of aphasia. Able to follow 2 step  commands without difficulty.  Cranial Nerves:  II:  Peripheral visual fields grossly normal, pupils, round, reactive to light III,IV, VI: ptosis not present, extra-ocular motions intact bilaterally  V,VII: smile symmetric, facial light touch sensation equal VIII: hearing grossly normal bilaterally  IX,X: midline uvula rise  XI: bilateral shoulder shrug equal and strong XII: midline tongue extension  Motor:  5/5 in upper and lower extremities bilaterally including strong and equal grip strength and dorsiflexion/plantar flexion Sensory: light touch normal in all extremities.  Cerebellar: normal finger-to-nose with bilateral upper extremities, pronator drift negative Gait: normal gait and balance       ED Treatments / Results  Labs (all labs ordered are listed, but only abnormal results are displayed) Labs Reviewed - No data to display  EKG None  Radiology No results found.  Procedures Procedures (including critical care time)  Medications Ordered in ED Medications  predniSONE (DELTASONE) tablet 60 mg (has no administration in time range)     Initial Impression / Assessment and Plan / ED Course  I have reviewed the triage vital signs and the nursing notes.  Pertinent labs & imaging results that were available during my care of the patient were reviewed by me and considered in my medical decision making (see chart for details).       Patient with a past medical history of headache presents to the ED with complaints of left-sided neck pain which began this morning.  Reports a similar episode in the past in which he slept wrong, was then treated with muscle relaxers along with anti-inflammatories.  Today he reports his pain has not improved after ibuprofen x2.  Does report having problems with "pinched nerve "in the past.  He has not had any neurological follow-up for this.  During evaluation patient is neuro intact, does not have any neck rigidity, has a full range of  motion of his neck with tenderness to palpation along the left side of his neck, low suspicion for meningitis, he has not been running any fevers, denies any blurry vision, weakness or tingling to his hands.  Suspicion the patient is likely suffering from a torticollis episode.  Patient did drive into the ED today, unable to provide him with a muscle relaxer.  We will send him home with short course of muscle relaxers along with anti-inflammatories to help with his pain.  He is not diabetic, was provided here with prednisone to help with his symptoms.  Vitals are otherwise within normal limits, he remains afebrile in the ED.  Patient stable  for discharge.    Portions of this note were generated with Lobbyist. Dictation errors may occur despite best attempts at proofreading.  Final Clinical Impressions(s) / ED Diagnoses   Final diagnoses:  Neck pain    ED Discharge Orders         Ordered    naproxen (NAPROSYN) 500 MG tablet  2 times daily     06/21/19 0910    methocarbamol (ROBAXIN) 500 MG tablet  2 times daily     06/21/19 0910           Janeece Fitting, PA-C 06/21/19 0911    Benjamin Chapel, MD 06/24/19 [phone]

## 2019-10-08 ENCOUNTER — Ambulatory Visit: Payer: Medicaid Other | Attending: Internal Medicine

## 2019-10-08 DIAGNOSIS — Z23 Encounter for immunization: Secondary | ICD-10-CM | POA: Insufficient documentation

## 2019-10-08 NOTE — Progress Notes (Signed)
   Covid-19 Vaccination Clinic  Name:  Benjamin Ingram    MRN: [id number] DOB: 1985-09-25  10/08/2019  Mr. Benjamin Ingram was observed post Covid-19 immunization for 15 minutes without incident. He was provided with Vaccine Information Sheet and instruction to access the V-Safe system.   Mr. Benjamin Ingram was instructed to call 911 with any severe reactions post vaccine: Marland Kitchen Difficulty breathing  . Swelling of face and throat  . A fast heartbeat  . A bad rash all over body  . Dizziness and weakness   Immunizations Administered    Name Date Dose VIS Date Route   Moderna COVID-19 Vaccine 10/08/2019  9:30 AM 0.5 mL 07/06/2019 Intramuscular   Manufacturer: Moderna   Lot: [id number]   NDC: 79038-333-83

## 2019-11-09 ENCOUNTER — Ambulatory Visit: Payer: Medicaid Other | Attending: Internal Medicine

## 2019-11-09 DIAGNOSIS — Z23 Encounter for immunization: Secondary | ICD-10-CM

## 2019-11-09 NOTE — Progress Notes (Signed)
   Covid-19 Vaccination Clinic  Name:  Benjamin Ingram    MRN: [id number] DOB: 11-06-1985  11/09/2019  [name] was observed post Covid-19 immunization for 15 minutes without incident. He was provided with Vaccine Information Sheet and instruction to access the V-Safe system.   [name] was instructed to call 911 with any severe reactions post vaccine: Marland Kitchen Difficulty breathing  . Swelling of face and throat  . A fast heartbeat  . A bad rash all over body  . Dizziness and weakness   Immunizations Administered    Name Date Dose VIS Date Route   Moderna COVID-19 Vaccine 11/09/2019 10:03 AM 0.5 mL 07/06/2019 Intramuscular   Manufacturer: Moderna   Lot: [id number]   NDC: 76808-811-03

## 2019-11-24 DIAGNOSIS — H5213 Myopia, bilateral: Secondary | ICD-10-CM | POA: Diagnosis not present

## 2019-12-12 ENCOUNTER — Other Ambulatory Visit: Payer: Self-pay

## 2019-12-12 ENCOUNTER — Encounter (HOSPITAL_COMMUNITY): Payer: Self-pay | Admitting: *Deleted

## 2019-12-12 ENCOUNTER — Emergency Department (HOSPITAL_COMMUNITY)
Admission: EM | Admit: 2019-12-12 | Discharge: 2019-12-12 | Disposition: A | Discharge status: Home / Self Care | Payer: Medicaid Other | Attending: Emergency Medicine | Admitting: Emergency Medicine

## 2019-12-12 DIAGNOSIS — R05 Cough: Secondary | ICD-10-CM | POA: Diagnosis present

## 2019-12-12 DIAGNOSIS — J111 Influenza due to unidentified influenza virus with other respiratory manifestations: Secondary | ICD-10-CM | POA: Diagnosis not present

## 2019-12-12 DIAGNOSIS — F17290 Nicotine dependence, other tobacco product, uncomplicated: Secondary | ICD-10-CM | POA: Insufficient documentation

## 2019-12-12 MED ORDER — BENZONATATE 100 MG PO CAPS
200.0000 mg | ORAL_CAPSULE | Freq: Once | ORAL | Status: AC
  Administered 2019-12-12: 20:00:00 200 mg via ORAL
  Filled 2019-12-12: qty 2

## 2019-12-12 MED ORDER — OSELTAMIVIR PHOSPHATE 75 MG PO CAPS: 75.0000 mg | ORAL_CAPSULE | Freq: Two times a day (BID) | ORAL | 0 refills | Status: AC

## 2019-12-12 MED ORDER — OSELTAMIVIR PHOSPHATE 75 MG PO CAPS
75.0000 mg | ORAL_CAPSULE | Freq: Once | ORAL | Status: AC
  Administered 2019-12-12: 75 mg via ORAL
  Filled 2019-12-12: qty 1

## 2019-12-12 MED ORDER — BENZONATATE 100 MG PO CAPS: 200.0000 mg | ORAL_CAPSULE | Freq: Two times a day (BID) | ORAL | 0 refills | Status: AC | PRN

## 2019-12-12 NOTE — ED Triage Notes (Signed)
Pt c/o sore throat, cough that is productive with green sputum, body aches, fatigue that started yesterday. Denies any fever,

## 2019-12-12 NOTE — ED Provider Notes (Signed)
[facility] EMERGENCY DEPARTMENT Provider Note   CSN: [id number] Arrival date & time: 12/12/19  1912     History Chief Complaint  Patient presents with  . Cough    Benjamin Ingram is a 34 y.o. male.  HPI   This patient denies any sick contacts, states that yesterday he woke up with a sore throat, got worse throughout the day, has had a sore throat and a cough and body aches today.  He does not have any nausea vomiting or diarrhea has not lost taste or smell and got his Covid vaccines between 4 and 6 weeks ago.  He states that he is continue to cough and has had some green phlegm.  Has been using throat oxygen with minimal relief.  Past Medical History:  Diagnosis Date  . Headache   . Vertigo     There are no problems to display for this patient.   History reviewed. No pertinent surgical history.     No family history on file.  Social History   Tobacco Use  . Smoking status: Current Every Day Smoker    Packs/day: 0.50    Types: E-cigarettes  . Smokeless tobacco: Never Used  Substance Use Topics  . Alcohol use: Never    Comment: rarely  . Drug use: No    Home Medications Prior to Admission medications   Medication Sig Start Date End Date Taking? Authorizing Provider  benzonatate (TESSALON) 100 MG capsule Take 2 capsules (200 mg total) by mouth 2 (two) times daily as needed for cough. 12/12/19   Eber Hong, MD  oseltamivir (TAMIFLU) 75 MG capsule Take 1 capsule (75 mg total) by mouth every 12 (twelve) hours. 12/12/19   Eber Hong, MD  promethazine (PHENERGAN) 25 MG tablet Take 1 tablet (25 mg total) by mouth every 6 (six) hours as needed for nausea or vomiting (or dizziness/vertigo). 01/25/17   Samuel Jester, DO    Allergies    Pineapple  Review of Systems   Review of Systems  Constitutional: Negative for chills and fever.  HENT: Positive for sore throat.   Respiratory: Positive for cough.   Gastrointestinal: Negative for abdominal pain, diarrhea and  nausea.    Physical Exam Updated Vital Signs BP 131/67   Pulse 78   Temp 98.4 F (36.9 C)   Resp 18   Ht 1.676 m (5\' 6" )   Wt 92.3 kg   SpO2 98%   BMI 32.85 kg/m   Physical Exam Vitals and nursing note reviewed.  Constitutional:      Appearance: He is well-developed. He is not diaphoretic.  HENT:     Head: Normocephalic and atraumatic.     Mouth/Throat:     Comments: Pharynx is erythematous without any exudate or asymmetry or hypertrophy, uvula is midline Eyes:     General:        Right eye: No discharge.        Left eye: No discharge.     Conjunctiva/sclera: Conjunctivae normal.  Cardiovascular:     Rate and Rhythm: Normal rate.  Pulmonary:     Effort: Pulmonary effort is normal. No respiratory distress.     Breath sounds: No wheezing or rales.  Musculoskeletal:     Cervical back: Normal range of motion.  Lymphadenopathy:     Cervical: No cervical adenopathy.  Skin:    General: Skin is warm and dry.     Findings: No erythema or rash.  Neurological:     Mental Status: He is alert.  Coordination: Coordination normal.     ED Results / Procedures / Treatments   Labs (all labs ordered are listed, but only abnormal results are displayed) Labs Reviewed - No data to display  EKG None  Radiology No results found.  Procedures Procedures (including critical care time)  Medications Ordered in ED Medications  oseltamivir (TAMIFLU) capsule 75 mg (has no administration in time range)  benzonatate (TESSALON) capsule 200 mg (has no administration in time range)    ED Course  I have reviewed the triage vital signs and the nursing notes.  Pertinent labs & imaging results that were available during my care of the patient were reviewed by me and considered in my medical decision making (see chart for details).    MDM Rules/Calculators/A&P                      This patient is well-appearing, suspect viral upper respiratory infection, with body aches coughing  sore throat and a Covid vaccination we will treat presumptively for influenza, likely viral, given Tessalon for home as well, patient states he has all of his questions answered and has no further request, I do not think imaging is needed at this time.  Patient is aware of the indications for return.  Final Clinical Impression(s) / ED Diagnoses Final diagnoses:  Influenza-like illness    Rx / DC Orders ED Discharge Orders         Ordered    oseltamivir (TAMIFLU) 75 MG capsule  Every 12 hours     12/12/19 1933    benzonatate (TESSALON) 100 MG capsule  2 times daily PRN     12/12/19 1933           Noemi Chapel, MD 12/12/19 1935

## 2019-12-12 NOTE — Discharge Instructions (Signed)
Please take the medications exactly as prescribed, read the attached instructions, quarantine at home until you are feeling much better with less cough, no body aches and no fevers.  Ibuprofen, 600 mg every 8 hours as needed for body aches, headache or fever  Tessalon Perles, 200 mg every 8 hours as needed for coughing  Tamiflu 1 tablet twice a day for the next 5 days  Seek medical exam for severe or worsening symptoms but be aware that you may be sick for the next week, see your doctor within 7 days if no improvement

## 2019-12-15 DIAGNOSIS — H5213 Myopia, bilateral: Secondary | ICD-10-CM | POA: Diagnosis not present

## 2019-12-20 ENCOUNTER — Emergency Department (HOSPITAL_COMMUNITY)
Admission: EM | Admit: 2019-12-20 | Discharge: 2019-12-20 | Disposition: A | Discharge status: Home / Self Care | Payer: Medicaid Other | Attending: Emergency Medicine | Admitting: Emergency Medicine

## 2019-12-20 ENCOUNTER — Encounter (HOSPITAL_COMMUNITY): Payer: Self-pay | Admitting: *Deleted

## 2019-12-20 ENCOUNTER — Other Ambulatory Visit: Payer: Self-pay

## 2019-12-20 DIAGNOSIS — M545 Low back pain, unspecified: Secondary | ICD-10-CM

## 2019-12-20 DIAGNOSIS — Z79899 Other long term (current) drug therapy: Secondary | ICD-10-CM | POA: Insufficient documentation

## 2019-12-20 DIAGNOSIS — F17210 Nicotine dependence, cigarettes, uncomplicated: Secondary | ICD-10-CM | POA: Diagnosis not present

## 2019-12-20 MED ORDER — METHOCARBAMOL 500 MG PO TABS: 500.0000 mg | ORAL_TABLET | Freq: Four times a day (QID) | ORAL | 0 refills | Status: DC

## 2019-12-20 MED ORDER — DICLOFENAC SODIUM 75 MG PO TBEC: 75.0000 mg | DELAYED_RELEASE_TABLET | Freq: Two times a day (BID) | ORAL | 0 refills | Status: AC

## 2019-12-20 NOTE — ED Triage Notes (Signed)
Pt c/o lower back pain after waking up and states the pain has been getting worse throughout the day; pt denies any urinary sx

## 2019-12-20 NOTE — Progress Notes (Signed)
CSW has reviewed patients chart and notes that patient is without primary care and/or insurance. CSW will follow up with patient regarding this matter and provide assistance as needed.   Gari Hartsell M. Kendrea Cerritos LCSWA Transitions of Care  Clinical Social Worker  Ph: [phone] 

## 2019-12-20 NOTE — ED Provider Notes (Signed)
[facility] EMERGENCY DEPARTMENT Provider Note   CSN: [id number] Arrival date & time: 12/20/19  1248     History Chief Complaint  Patient presents with  . Back Pain    Benjamin Ingram is a 34 y.o. male.  The history is provided by the patient. No language interpreter was used.  Back Pain Location:  Lumbar spine Quality:  Aching Radiates to:  Does not radiate Pain severity:  Moderate Timing:  Constant Progression:  Worsening Chronicity:  New Context: not recent injury   Relieved by:  Nothing Worsened by:  Nothing Ineffective treatments:  None tried Associated symptoms: no fever, no numbness and no paresthesias   Pt reports he has back pain that flares up a couple of times a year.  Pain is normally relieved with a muscle relaxer and rest.      Past Medical History:  Diagnosis Date  . Headache   . Vertigo     There are no problems to display for this patient.   History reviewed. No pertinent surgical history.     History reviewed. No pertinent family history.  Social History   Tobacco Use  . Smoking status: Current Every Day Smoker    Packs/day: 0.50    Types: E-cigarettes  . Smokeless tobacco: Never Used  Substance Use Topics  . Alcohol use: Never    Comment: rarely  . Drug use: No    Home Medications Prior to Admission medications   Medication Sig Start Date End Date Taking? Authorizing Provider  benzonatate (TESSALON) 100 MG capsule Take 2 capsules (200 mg total) by mouth 2 (two) times daily as needed for cough. 12/12/19   Noemi Chapel, MD  diclofenac (VOLTAREN) 75 MG EC tablet Take 1 tablet (75 mg total) by mouth 2 (two) times daily. 12/20/19   Fransico Meadow, PA-C  methocarbamol (ROBAXIN) 500 MG tablet Take 1 tablet (500 mg total) by mouth 4 (four) times daily. 12/20/19   Fransico Meadow, PA-C  oseltamivir (TAMIFLU) 75 MG capsule Take 1 capsule (75 mg total) by mouth every 12 (twelve) hours. 12/12/19   Noemi Chapel, MD  promethazine (PHENERGAN) 25 MG  tablet Take 1 tablet (25 mg total) by mouth every 6 (six) hours as needed for nausea or vomiting (or dizziness/vertigo). 01/25/17   Francine Graven, DO    Allergies    Pineapple  Review of Systems   Review of Systems  Constitutional: Negative for fever.  Musculoskeletal: Positive for back pain.  Neurological: Negative for numbness and paresthesias.  All other systems reviewed and are negative.   Physical Exam Updated Vital Signs BP 127/89 (BP Location: Right Arm)   Pulse 72   Temp 98.3 F (36.8 C) (Oral)   Resp 16   Ht 5\' 6"  (1.676 m)   Wt 89.8 kg   SpO2 92%   BMI 31.96 kg/m   Physical Exam Vitals and nursing note reviewed.  Constitutional:      Appearance: He is well-developed.  HENT:     Head: Normocephalic and atraumatic.  Eyes:     Conjunctiva/sclera: Conjunctivae normal.  Cardiovascular:     Rate and Rhythm: Normal rate and regular rhythm.     Heart sounds: No murmur.  Pulmonary:     Effort: Pulmonary effort is normal. No respiratory distress.     Breath sounds: Normal breath sounds.  Abdominal:     Palpations: Abdomen is soft.     Tenderness: There is no abdominal tenderness.  Musculoskeletal:  General: Normal range of motion.     Cervical back: Neck supple.     Comments: Tender ls spine pain with range of motion,  nv and ns intact  Skin:    General: Skin is warm and dry.  Neurological:     General: No focal deficit present.     Mental Status: He is alert.  Psychiatric:        Mood and Affect: Mood normal.     ED Results / Procedures / Treatments   Labs (all labs ordered are listed, but only abnormal results are displayed) Labs Reviewed - No data to display  EKG None  Radiology No results found.  Procedures Procedures (including critical care time)  Medications Ordered in ED Medications - No data to display  ED Course  I have reviewed the triage vital signs and the nursing notes.  Pertinent labs & imaging results that were  available during my care of the patient were reviewed by me and considered in my medical decision making (see chart for details).    MDM Rules/Calculators/A&P                      MDM: Pt given note for out of work for 2 days  Final Clinical Impression(s) / ED Diagnoses Final diagnoses:  Acute low back pain without sciatica, unspecified back pain laterality    Rx / DC Orders ED Discharge Orders         Ordered    diclofenac (VOLTAREN) 75 MG EC tablet  2 times daily     12/20/19 1439    methocarbamol (ROBAXIN) 500 MG tablet  4 times daily     12/20/19 1439        An After Visit Summary was printed and given to the patient.    Elson Areas, [place] 12/20/19 1541    Mancel Bale, MD 12/21/19 [phone]

## 2020-03-11 ENCOUNTER — Encounter (HOSPITAL_COMMUNITY): Payer: Self-pay | Admitting: *Deleted

## 2020-03-11 ENCOUNTER — Emergency Department (HOSPITAL_COMMUNITY)
Admission: EM | Admit: 2020-03-11 | Discharge: 2020-03-11 | Disposition: A | Discharge status: Home / Self Care | Payer: Medicaid Other | Attending: Emergency Medicine | Admitting: Emergency Medicine

## 2020-03-11 DIAGNOSIS — H16002 Unspecified corneal ulcer, left eye: Secondary | ICD-10-CM | POA: Insufficient documentation

## 2020-03-11 DIAGNOSIS — H5712 Ocular pain, left eye: Secondary | ICD-10-CM | POA: Diagnosis present

## 2020-03-11 DIAGNOSIS — F17210 Nicotine dependence, cigarettes, uncomplicated: Secondary | ICD-10-CM | POA: Diagnosis not present

## 2020-03-11 MED ORDER — MOXIFLOXACIN HCL 0.5 % OP SOLN: 1.0000 [drp] | Freq: Three times a day (TID) | OPHTHALMIC | 0 refills | Status: AC

## 2020-03-11 MED ORDER — TETRACAINE HCL 0.5 % OP SOLN
2.0000 [drp] | Freq: Once | OPHTHALMIC | Status: AC
  Administered 2020-03-11: 2 [drp] via OPHTHALMIC
  Filled 2020-03-11: qty 4

## 2020-03-11 MED ORDER — FLUORESCEIN SODIUM 1 MG OP STRP
1.0000 | ORAL_STRIP | Freq: Once | OPHTHALMIC | Status: AC
  Administered 2020-03-11: 1 via OPHTHALMIC

## 2020-03-11 NOTE — Discharge Instructions (Addendum)
Apply drops to your left eye as prescribed. Follow up with an eye doctor Monday, either your eye doctor at [facility] or the Ophthalmologist referral given. Do not wear contacts again until cleared by an eye doctor.

## 2020-03-11 NOTE — ED Triage Notes (Signed)
Left eye irritation onset today

## 2020-03-11 NOTE — ED Provider Notes (Signed)
[facility] EMERGENCY DEPARTMENT Provider Note   CSN: [id number] Arrival date & time: 03/11/20  1133     History Chief Complaint  Patient presents with   Eye Problem    Benjamin Ingram is a 34 y.o. male.  34 year old male with left eye irritation onset this morning. Patient wears contacts, states he slept in his contacts last night (not made to sleep in). Denies eye pain or swelling, trauma, foreign body, visual changes, no photophobia. No URI symptoms, no sick contacts.         Past Medical History:  Diagnosis Date   Headache    Vertigo     There are no problems to display for this patient.   History reviewed. No pertinent surgical history.     History reviewed. No pertinent family history.  Social History   Tobacco Use   Smoking status: Current Every Day Smoker    Packs/day: 0.50    Types: E-cigarettes   Smokeless tobacco: Never Used  Vaping Use   Vaping Use: Every day   Substances: Nicotine  Substance Use Topics   Alcohol use: Never    Comment: rarely   Drug use: No    Home Medications Prior to Admission medications   Medication Sig Start Date End Date Taking? Authorizing Provider  benzonatate (TESSALON) 100 MG capsule Take 2 capsules (200 mg total) by mouth 2 (two) times daily as needed for cough. 12/12/19   Eber Hong, MD  diclofenac (VOLTAREN) 75 MG EC tablet Take 1 tablet (75 mg total) by mouth 2 (two) times daily. 12/20/19   Elson Areas, PA-C  methocarbamol (ROBAXIN) 500 MG tablet Take 1 tablet (500 mg total) by mouth 4 (four) times daily. 12/20/19   Elson Areas, PA-C  moxifloxacin (VIGAMOX) 0.5 % ophthalmic solution Place 1 drop into the left eye 3 (three) times daily. 03/11/20   Jeannie Fend, PA-C  oseltamivir (TAMIFLU) 75 MG capsule Take 1 capsule (75 mg total) by mouth every 12 (twelve) hours. 12/12/19   Eber Hong, MD  promethazine (PHENERGAN) 25 MG tablet Take 1 tablet (25 mg total) by mouth every 6 (six) hours as needed for  nausea or vomiting (or dizziness/vertigo). 01/25/17   Samuel Jester, DO    Allergies    Pineapple  Review of Systems   Review of Systems  Constitutional: Negative for fever.  HENT: Negative for congestion and rhinorrhea.   Eyes: Positive for redness. Negative for photophobia, pain, discharge, itching and visual disturbance.  Respiratory: Negative for cough.   Skin: Negative for rash and wound.  Allergic/Immunologic: Negative for immunocompromised state.    Physical Exam Updated Vital Signs BP (!) 134/98    Pulse 76    Temp 98.3 F (36.8 C)    Resp 20    Ht 5\' 8"  (1.727 m)    Wt (!) 201.7 kg    SpO2 98%    BMI 67.61 kg/m   Physical Exam Vitals and nursing note reviewed.  Constitutional:      General: He is not in acute distress.    Appearance: He is well-developed. He is not diaphoretic.  HENT:     Head: Normocephalic and atraumatic.  Eyes:     General:        Right eye: No discharge.        Left eye: No discharge.     Extraocular Movements: Extraocular movements intact.     Conjunctiva/sclera:     Right eye: Right conjunctiva is not injected.  Left eye: Left conjunctiva is injected.     Pupils: Pupils are equal, round, and reactive to light.     Left eye: Fluorescein uptake present. Seidel exam negative.    Slit lamp exam:    Right eye: Anterior chamber quiet.     Left eye: Anterior chamber quiet. Corneal ulcer present.   Pulmonary:     Effort: Pulmonary effort is normal.  Musculoskeletal:     Cervical back: Neck supple. No tenderness.  Skin:    General: Skin is warm and dry.     Findings: No erythema or rash.  Neurological:     Mental Status: He is alert and oriented to person, place, and time.  Psychiatric:        Behavior: Behavior normal.     ED Results / Procedures / Treatments   Labs (all labs ordered are listed, but only abnormal results are displayed) Labs Reviewed - No data to display  EKG None  Radiology No results  found.  Procedures Procedures (including critical care time)  Medications Ordered in ED Medications  tetracaine (PONTOCAINE) 0.5 % ophthalmic solution 2 drop (2 drops Both Eyes Given by Other 03/11/20 1307)  fluorescein ophthalmic strip 1 strip (1 strip Both Eyes Given 03/11/20 1307)    ED Course  I have reviewed the triage vital signs and the nursing notes.  Pertinent labs & imaging results that were available during my care of the patient were reviewed by me and considered in my medical decision making (see chart for details).  Clinical Course as of Mar 12 1311  Sat Mar 11, 2020  3924 34 year old male with complaint of left eye irritation upon waking this morning.  Patient wears contact lenses, lenses are not meant to be slept in however he regularly sleeps in these contacts. Denies photophobia, eye pain, drainage from the eye.  On exam has mild injection of left conjunctiva, anterior chamber is calm.  Does have a small 1 to 2 mm ulcer noted at the 3 o'clock position of the left eye.  Visual acuity intact at 20/20, 20/25 right, 20/25 left with his glasses on.  Advised not to wear his contact lenses until cleared by an eye doctor.  Given prescription for Vigamox drops, recommend starting use today, follow-up with either his eye doctor on Monday or referral to ophthalmology.   [LM]    Clinical Course User Index [LM] Alden Hipp   MDM Rules/Calculators/A&P                          Final Clinical Impression(s) / ED Diagnoses Final diagnoses:  Ulcer of left cornea    Rx / DC Orders ED Discharge Orders         Ordered    moxifloxacin (VIGAMOX) 0.5 % ophthalmic solution  3 times daily     Discontinue  Reprint     03/11/20 1307           Jeannie Fend, PA-C 03/11/20 1312    Bethann Berkshire, MD 03/14/20 1030

## 2020-03-14 ENCOUNTER — Telehealth: Payer: Self-pay | Admitting: *Deleted

## 2020-03-14 NOTE — Telephone Encounter (Signed)
Contacted pt to complete transition of care assessment:  Transition Care Management Follow-up Telephone Call  . Medicaid Managed Care Transition Call Status:MM [facility] Call Made  . Date of discharge and from where: [facility], 03/11/20  . How have you been since you were released from the hospital? "slowly getting better"  . Any questions or concerns? No  Items Reviewed: Marland Kitchen Did the pt receive and understand the discharge instructions provided? Yes  . Medications obtained and verified? Yes  . Any new allergies since your discharge? No  . Dietary orders reviewed ?n/a . Do you have support at home? Yes, family  Functional Questionnaire: (I = Independent and D = Dependent)  ADLs: Independent Bathing/Dressing:Independent Meal Prep: Independent Eating: Independent Maintaining continence: Independent Transferring/Ambulation: Independent Managing Meds: Independent Follow up appointments reviewed:  PCP Hospital f/u appt confirmed? No   Pt would not like to be assigned a PCP because he is going to sign up with his wife's MD  Specialist Hospital f/u appt confirmed? Pt declined going to ophthalmology. States he is going to "eyeglass place" first, then ophthalmology if needed Are transportation arrangements needed? No   If their condition worsens, is the pt aware to call PCP or go to the EmergencyDept.? Yes  Was the patient provided with contact information for the PCP's office or ED? yes  Was to pt encouraged tocall back with questions or concerns?  Yes  Burnard Bunting, RN, BSN, CCRN [facility] [phone]

## 2020-10-16 ENCOUNTER — Other Ambulatory Visit: Payer: Self-pay

## 2020-10-16 ENCOUNTER — Encounter: Payer: Self-pay | Admitting: Emergency Medicine

## 2020-10-16 ENCOUNTER — Ambulatory Visit
Admission: EM | Admit: 2020-10-16 | Discharge: 2020-10-16 | Disposition: A | Discharge status: Home / Self Care | Payer: Medicaid Other | Attending: Family Medicine | Admitting: Family Medicine

## 2020-10-16 DIAGNOSIS — M25512 Pain in left shoulder: Secondary | ICD-10-CM | POA: Diagnosis not present

## 2020-10-16 MED ORDER — DEXAMETHASONE 1 MG/ML PO CONC
10.0000 mg | Freq: Once | ORAL | Status: AC
  Administered 2020-10-16: 10 mg via ORAL

## 2020-10-16 MED ORDER — METHOCARBAMOL 500 MG PO TABS: 500.0000 mg | ORAL_TABLET | Freq: Four times a day (QID) | ORAL | 0 refills | Status: AC

## 2020-10-16 NOTE — ED Triage Notes (Signed)
HX of pinched nerve in LT shoulder, having pain that flared up this morning.

## 2020-10-16 NOTE — ED Provider Notes (Signed)
[facility]    CSN: [id number] Arrival date & time: 10/16/20  1249      History   Chief Complaint No chief complaint on file.   HPI Benjamin Ingram is a 35 y.o. male.   HPI  Patient with a history of multiple episodes of acute on chronic back pain.  Presents today with left shoulder pain.  Patient reports a history of a pinched nerve involving the left shoulder.  He reports on awakening this morning he experienced pain involving the left posterior shoulder and has pain with bending down and certain turning movements. He has take Advil 800 mg without relief. Pain normally responds to rest and Robaxin. Denies any known injury.  Past Medical History:  Diagnosis Date  . Headache   . Vertigo     There are no problems to display for this patient.   History reviewed. No pertinent surgical history.     Home Medications    Prior to Admission medications   Medication Sig Start Date End Date Taking? Authorizing Provider  benzonatate (TESSALON) 100 MG capsule Take 2 capsules (200 mg total) by mouth 2 (two) times daily as needed for cough. 12/12/19   Eber Hong, MD  diclofenac (VOLTAREN) 75 MG EC tablet Take 1 tablet (75 mg total) by mouth 2 (two) times daily. 12/20/19   Elson Areas, PA-C  methocarbamol (ROBAXIN) 500 MG tablet Take 1 tablet (500 mg total) by mouth 4 (four) times daily. 12/20/19   Elson Areas, PA-C  moxifloxacin (VIGAMOX) 0.5 % ophthalmic solution Place 1 drop into the left eye 3 (three) times daily. 03/11/20   Jeannie Fend, PA-C  oseltamivir (TAMIFLU) 75 MG capsule Take 1 capsule (75 mg total) by mouth every 12 (twelve) hours. 12/12/19   Eber Hong, MD  promethazine (PHENERGAN) 25 MG tablet Take 1 tablet (25 mg total) by mouth every 6 (six) hours as needed for nausea or vomiting (or dizziness/vertigo). 01/25/17   Samuel Jester, DO    Family History No family history on file.  Social History Social History   Tobacco Use  . Smoking status:  Current Every Day Smoker    Packs/day: 0.50    Types: E-cigarettes  . Smokeless tobacco: Never Used  Vaping Use  . Vaping Use: Every day  . Substances: Nicotine  Substance Use Topics  . Alcohol use: Never    Comment: rarely  . Drug use: No     Allergies   Pineapple   Review of Systems Review of Systems Pertinent negatives listed in HPI  Physical Exam Triage Vital Signs ED Triage Vitals  Enc Vitals Group     BP 10/16/20 1407 (!) 152/97     Pulse Rate 10/16/20 1407 74     Resp 10/16/20 1407 16     Temp 10/16/20 1407 98.3 F (36.8 C)     Temp Source 10/16/20 1407 Oral     SpO2 10/16/20 1407 98 %     Weight --      Height --      Head Circumference --      Peak Flow --      Pain Score 10/16/20 1413 6     Pain Loc --      Pain Edu? --      Excl. in GC? --    No data found.  Updated Vital Signs BP (!) 152/97 (BP Location: Right Arm)   Pulse 74   Temp 98.3 F (36.8 C) (Oral)   Resp  16   SpO2 98%   Visual Acuity Right Eye Distance:   Left Eye Distance:   Bilateral Distance:    Right Eye Near:   Left Eye Near:    Bilateral Near:     Physical Exam Constitutional:      Appearance: Normal appearance. He is not ill-appearing.  Cardiovascular:     Rate and Rhythm: Normal rate and regular rhythm.  Pulmonary:     Effort: Pulmonary effort is normal.     Breath sounds: Normal breath sounds.  Musculoskeletal:       Arms:     Cervical back: Normal range of motion. No rigidity.     Comments: Full extension, flexion of LUE. Adduction/Abduction full ROM with pain in left shoulder present   Lymphadenopathy:     Cervical: No cervical adenopathy.  Skin:    General: Skin is warm and dry.     Capillary Refill: Capillary refill takes less than 2 seconds.  Neurological:     Mental Status: He is alert.  Psychiatric:        Mood and Affect: Mood normal.        Behavior: Behavior normal.        Thought Content: Thought content normal.        Judgment: Judgment  normal.      UC Treatments / Results  Labs (all labs ordered are listed, but only abnormal results are displayed) Labs Reviewed - No data to display  EKG   Radiology No results found.  Procedures Procedures (including critical care time)  Medications Ordered in UC Medications  dexamethasone (DECADRON) 1 MG/ML solution 10 mg (10 mg Oral Given 10/16/20 1507)    Initial Impression / Assessment and Plan / UC Course  I have reviewed the triage vital signs and the nursing notes.  Pertinent labs & imaging results that were available during my care of the patient were reviewed by me and considered in my medical decision making (see chart for details).     Left shoulder joint pain, acute on chronic. No injury. Decadron 10 mg PO here in clinic prescribed. Robaxin QID PRN for acute pain. Work note provided with a return 10/18/20. Final Clinical Impressions(s) / UC Diagnoses   Final diagnoses:  Localized pain of left shoulder joint   Discharge Instructions   None    ED Prescriptions    Medication Sig Dispense Auth. Provider   methocarbamol (ROBAXIN) 500 MG tablet Take 1 tablet (500 mg total) by mouth 4 (four) times daily. 28 tablet Bing Neighbors, FNP     PDMP not reviewed this encounter.   Bing Neighbors, FNP 10/16/20 [phone]

## 2021-01-29 ENCOUNTER — Ambulatory Visit
Admission: EM | Admit: 2021-01-29 | Discharge: 2021-01-29 | Disposition: A | Discharge status: Home / Self Care | Payer: Medicaid Other | Attending: Family Medicine | Admitting: Family Medicine

## 2021-01-29 ENCOUNTER — Other Ambulatory Visit: Payer: Self-pay

## 2021-01-29 DIAGNOSIS — M542 Cervicalgia: Secondary | ICD-10-CM

## 2021-01-29 DIAGNOSIS — G8929 Other chronic pain: Secondary | ICD-10-CM

## 2021-01-29 MED ORDER — PREDNISONE 20 MG PO TABS: 40.0000 mg | ORAL_TABLET | Freq: Every day | ORAL | 0 refills | Status: DC

## 2021-01-29 MED ORDER — CYCLOBENZAPRINE HCL 10 MG PO TABS: 10.0000 mg | ORAL_TABLET | Freq: Every day | ORAL | 0 refills | Status: DC

## 2021-01-29 NOTE — ED Provider Notes (Signed)
[facility]    CSN: [id number] Arrival date & time: 01/29/21  0815      History   Chief Complaint No chief complaint on file.   HPI Benjamin Ingram is a 35 y.o. male.   HPI Patient with a history of recurrent musculoskeletal pain, presents today with neck pain.  He denies any injury.  Pain started yesterday evening and upon awakening this morning he reports that the pain had worsened.  Previously prescribed Robaxin for back and shoulder pain reports that he is currently out of this medication however he did not achieve rate pain relief previously with taking Robaxin.  He has taken ibuprofen with mild improvement of symptoms.  He is experiencing pain with left lateral rotations and hyperextension of the neck.  Describes the pain as tension and persistent ache.  Denies any other symptoms. Past Medical History:  Diagnosis Date   Headache    Vertigo     There are no problems to display for this patient.   History reviewed. No pertinent surgical history.     Home Medications    Prior to Admission medications   Medication Sig Start Date End Date Taking? Authorizing Provider  cyclobenzaprine (FLEXERIL) 10 MG tablet Take 1 tablet (10 mg total) by mouth at bedtime. 01/29/21  Yes Bing Neighbors, FNP  predniSONE (DELTASONE) 20 MG tablet Take 2 tablets (40 mg total) by mouth daily with breakfast. 01/29/21  Yes Bing Neighbors, FNP  benzonatate (TESSALON) 100 MG capsule Take 2 capsules (200 mg total) by mouth 2 (two) times daily as needed for cough. 12/12/19   Eber Hong, MD  diclofenac (VOLTAREN) 75 MG EC tablet Take 1 tablet (75 mg total) by mouth 2 (two) times daily. 12/20/19   Elson Areas, PA-C  methocarbamol (ROBAXIN) 500 MG tablet Take 1 tablet (500 mg total) by mouth 4 (four) times daily. 10/16/20   Bing Neighbors, FNP  moxifloxacin (VIGAMOX) 0.5 % ophthalmic solution Place 1 drop into the left eye 3 (three) times daily. 03/11/20   Jeannie Fend, PA-C   oseltamivir (TAMIFLU) 75 MG capsule Take 1 capsule (75 mg total) by mouth every 12 (twelve) hours. 12/12/19   Eber Hong, MD  promethazine (PHENERGAN) 25 MG tablet Take 1 tablet (25 mg total) by mouth every 6 (six) hours as needed for nausea or vomiting (or dizziness/vertigo). 01/25/17   Samuel Jester, DO    Family History Family History  Problem Relation Age of Onset   Healthy Mother    Healthy Father     Social History Social History   Tobacco Use   Smoking status: Every Day    Packs/day: 0.50    Pack years: 0.00    Types: E-cigarettes, Cigarettes   Smokeless tobacco: Never  Vaping Use   Vaping Use: Every day   Substances: Nicotine  Substance Use Topics   Alcohol use: Never    Comment: rarely   Drug use: No     Allergies   Pineapple   Review of Systems Review of Systems Pertinent negatives listed in HPI  Triage Vital Signs ED Triage Vitals  Enc Vitals Group     BP 01/29/21 0831 (!) 153/91     Pulse Rate 01/29/21 0831 73     Resp 01/29/21 0831 16     Temp 01/29/21 0831 (!) 97.5 F (36.4 C)     Temp Source 01/29/21 0831 Tympanic     SpO2 01/29/21 0831 97 %     Weight --  Height --      Head Circumference --      Peak Flow --      Pain Score 01/29/21 0840 3     Pain Loc --      Pain Edu? --      Excl. in GC? --    No data found.  Updated Vital Signs BP (!) 153/91 (BP Location: Right Arm)   Pulse 73   Temp (!) 97.5 F (36.4 C) (Tympanic)   Resp 16   SpO2 97%   Visual Acuity Right Eye Distance:   Left Eye Distance:   Bilateral Distance:    Right Eye Near:   Left Eye Near:    Bilateral Near:     Physical Exam Cardiovascular:     Rate and Rhythm: Normal rate and regular rhythm.  Pulmonary:     Effort: Pulmonary effort is normal.     Breath sounds: Normal breath sounds and air entry.  Musculoskeletal:        General: Normal range of motion.     Cervical back: Tenderness present. No edema. Pain with movement, spinous process  tenderness and muscular tenderness present.  Skin:    General: Skin is warm and dry.     Capillary Refill: Capillary refill takes less than 2 seconds.  Neurological:     General: No focal deficit present.     Mental Status: He is alert.   UC Treatments / Results  Labs (all labs ordered are listed, but only abnormal results are displayed) Labs Reviewed - No data to display  EKG   Radiology No results found.  Procedures Procedures (including critical care time)  Medications Ordered in UC Medications - No data to display  Initial Impression / Assessment and Plan / UC Course  I have reviewed the triage vital signs and the nursing notes.  Pertinent labs & imaging results that were available during my care of the patient were reviewed by me and considered in my medical decision making (see chart for details).     Patient with chronic recurrent midline posterior neck pain.  No acute injury.  Treating today with prednisone 40 mg once daily for 5 days.  For acute pain cyclobenzaprine at bedtime.  Ortho follow-up if symptoms worsen or do not improve. 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 Provider   cyclobenzaprine (FLEXERIL) 10 MG tablet Take 1 tablet (10 mg total) by mouth at bedtime. 20 tablet Bing Neighbors, FNP   predniSONE (DELTASONE) 20 MG tablet Take 2 tablets (40 mg total) by mouth daily with breakfast. 10 tablet Bing Neighbors, FNP      PDMP not reviewed this encounter.   Bing Neighbors, FNP 01/29/21 1455

## 2021-01-29 NOTE — ED Triage Notes (Signed)
Pt reports posterior neck pain starting yesterday but significantly worse this morning.  Worse with movement.  Pain does not radiate to either upper extremity.  No known injury.  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  Took Ibuprofen yesterday with some relief, no otc meds today.

## 2021-02-11 ENCOUNTER — Other Ambulatory Visit: Payer: Self-pay

## 2021-02-11 ENCOUNTER — Emergency Department (HOSPITAL_COMMUNITY): Payer: Medicaid Other

## 2021-02-11 ENCOUNTER — Emergency Department (HOSPITAL_COMMUNITY)
Admission: EM | Admit: 2021-02-11 | Discharge: 2021-02-11 | Disposition: A | Discharge status: Home / Self Care | Payer: Medicaid Other | Attending: Emergency Medicine | Admitting: Emergency Medicine

## 2021-02-11 ENCOUNTER — Encounter (HOSPITAL_COMMUNITY): Payer: Self-pay | Admitting: *Deleted

## 2021-02-11 DIAGNOSIS — F17210 Nicotine dependence, cigarettes, uncomplicated: Secondary | ICD-10-CM | POA: Insufficient documentation

## 2021-02-11 DIAGNOSIS — M545 Low back pain, unspecified: Secondary | ICD-10-CM | POA: Insufficient documentation

## 2021-02-11 MED ORDER — PREDNISONE 10 MG PO TABS: 30.0000 mg | ORAL_TABLET | Freq: Every day | ORAL | 0 refills | Status: AC

## 2021-02-11 MED ORDER — CYCLOBENZAPRINE HCL 10 MG PO TABS: 10.0000 mg | ORAL_TABLET | Freq: Two times a day (BID) | ORAL | 0 refills | Status: AC | PRN

## 2021-02-11 NOTE — Discharge Instructions (Addendum)
You have been seen here for back pain.  I have given you a prescription for steroids please take as prescribed.  also given a muscle relaxer this can make you drowsy do not consume alcohol or operate heavy machinery when taking this medication.  I recommend taking over-the-counter pain medications like ibuprofen and/or Tylenol every 6 as needed.  Please follow dosage and on the back of bottle.  I also recommend applying heat to the area and stretching out the muscles as this will help decrease stiffness and pain.  I have given you information on exercises please follow.  Please follow-up with your primary care provider and/or neurosurgery for further evaluation of your back that shows that you might have some canal narrowing.  Come back to the emergency department if you develop chest pain, shortness of breath, severe abdominal pain, uncontrolled nausea, vomiting, diarrhea.

## 2021-02-11 NOTE — ED Provider Notes (Signed)
[facility] EMERGENCY DEPARTMENT Provider Note   CSN: [id number] Arrival date & time: 02/11/21  1732     History Chief Complaint  Patient presents with   Back Pain    Benjamin Ingram is a 35 y.o. male.  HPI  Patient with no significant medical history presents to the emergency department with chief complaint of lower back pain.  Patient states this started earlier today, states he was at the laundry mat and was picking up a load of laundry and  felt a pop in his back, he then had severe pain in his lower back. He States the pain does not radiate, denies paresthesia or weakness in his lower extremities, denies urinary incontinence, retention, difficult bowel movements, he has no other urinary symptoms.  He states he is unable to straighten out his back, walking, twisting or bending tends to make the pain worse, he has no history of connective tissue or order or Marfan's disease, denies history of aneurysms.  He denies alleviating factors.  Patient does not endorse fevers, chills, chest pain or shortness of breath.  Past Medical History:  Diagnosis Date   Headache    Vertigo     There are no problems to display for this patient.   History reviewed. No pertinent surgical history.     Family History  Problem Relation Age of Onset   Healthy Mother    Healthy Father     Social History   Tobacco Use   Smoking status: Every Day    Packs/day: 0.50    Pack years: 0.00    Types: E-cigarettes, Cigarettes   Smokeless tobacco: Never  Vaping Use   Vaping Use: Every day   Substances: Nicotine  Substance Use Topics   Alcohol use: Never    Comment: rarely   Drug use: No    Home Medications Prior to Admission medications   Medication Sig Start Date End Date Taking? Authorizing Provider  cyclobenzaprine (FLEXERIL) 10 MG tablet Take 1 tablet (10 mg total) by mouth 2 (two) times daily as needed for muscle spasms. 02/11/21  Yes Carroll Sage, PA-C  predniSONE (DELTASONE) 10  MG tablet Take 3 tablets (30 mg total) by mouth daily for 4 days. 02/11/21 02/15/21 Yes Carroll Sage, PA-C  benzonatate (TESSALON) 100 MG capsule Take 2 capsules (200 mg total) by mouth 2 (two) times daily as needed for cough. 12/12/19   Eber Hong, MD  diclofenac (VOLTAREN) 75 MG EC tablet Take 1 tablet (75 mg total) by mouth 2 (two) times daily. 12/20/19   Elson Areas, PA-C  methocarbamol (ROBAXIN) 500 MG tablet Take 1 tablet (500 mg total) by mouth 4 (four) times daily. 10/16/20   Bing Neighbors, FNP  moxifloxacin (VIGAMOX) 0.5 % ophthalmic solution Place 1 drop into the left eye 3 (three) times daily. 03/11/20   Jeannie Fend, PA-C  oseltamivir (TAMIFLU) 75 MG capsule Take 1 capsule (75 mg total) by mouth every 12 (twelve) hours. 12/12/19   Eber Hong, MD  promethazine (PHENERGAN) 25 MG tablet Take 1 tablet (25 mg total) by mouth every 6 (six) hours as needed for nausea or vomiting (or dizziness/vertigo). 01/25/17   Samuel Jester, DO    Allergies    Pineapple  Review of Systems   Review of Systems  Constitutional:  Negative for chills and fever.  HENT:  Negative for congestion.   Respiratory:  Negative for shortness of breath.   Cardiovascular:  Negative for chest pain.  Gastrointestinal:  Negative for  abdominal pain.  Genitourinary:  Negative for enuresis.  Musculoskeletal:  Positive for back pain.  Skin:  Negative for rash.  Neurological:  Negative for dizziness.  Hematological:  Does not bruise/bleed easily.   Physical Exam Updated Vital Signs BP (!) 145/77 (BP Location: Right Arm)   Pulse 79   Temp 98.5 F (36.9 C) (Oral)   Resp 14   Ht 5\' 8"  (1.727 m)   Wt 104.3 kg   SpO2 98%   BMI 34.97 kg/m   Physical Exam Vitals and nursing note reviewed.  Constitutional:      General: He is not in acute distress.    Appearance: He is not ill-appearing.  HENT:     Head: Normocephalic and atraumatic.     Nose: No congestion.  Eyes:     Conjunctiva/sclera:  Conjunctivae normal.  Cardiovascular:     Rate and Rhythm: Normal rate and regular rhythm.     Pulses: Normal pulses.  Pulmonary:     Effort: Pulmonary effort is normal.  Musculoskeletal:     Comments: Spine was palpated non tender to palpation, no step-off or deformities present.  Patient has full range of motion 5/ 5 strength neurovascular intact in lower extremities.  Patient positive straight leg raises bilaterally patient is able to ambulate without difficulty.  Skin:    General: Skin is warm and dry.  Neurological:     Mental Status: He is alert.  Psychiatric:        Mood and Affect: Mood normal.    ED Results / Procedures / Treatments   Labs (all labs ordered are listed, but only abnormal results are displayed) Labs Reviewed - No data to display  EKG None  Radiology CT Lumbar Spine Wo Contrast  Result Date: 02/11/2021 CLINICAL DATA:  Low back pain, trauma. EXAM: CT LUMBAR SPINE WITHOUT CONTRAST TECHNIQUE: Multidetector CT imaging of the lumbar spine was performed without intravenous contrast administration. Multiplanar CT image reconstructions were also generated. COMPARISON:  None. FINDINGS: Segmentation: 5 non rib-bearing lumbar vertebral bodies. Alignment: No substantial sagittal subluxation. Mild dextrocurvature. Vertebrae: Vertebral body heights are maintained. Bilateral node appearing pars defects at L4, which are corticated. Pseudoarticulation of the right L5 transverse process with the sacrum. Paraspinal and other soft tissues: Unremarkable. Disc levels: Degenerative disease at L4-L5 with disc height loss and endplate Schmorl's nodes. There also is a broad disc bulge at this level with ligamentum flavum thickening and suspected at least moderate canal and bilateral subarticular recess stenosis. Additionally, there is suggestion of a disc bulges/protrusions at L1-L2 and L3-L4 with potential canal stenosis at these levels. IMPRESSION: 1. No evidence of acute fracture or  traumatic subluxation. 2. Remote appearing/corticated bilateral L4 pars defects without substantial subluxation. There is degenerative disc disease at this level and a broad disc bulge with suspected at least moderate canal and bilateral subarticular recess stenosis. Additionally, there is suggestion of a disc bulges/protrusions at L1-L2 and L3-L4 with potential canal stenosis at these levels. Recommend an MRI to better evaluate the canal. Electronically Signed   By: 04/14/2021 MD   On: 02/11/2021 19:20    Procedures Procedures   Medications Ordered in ED Medications - No data to display  ED Course  I have reviewed the triage vital signs and the nursing notes.  Pertinent labs & imaging results that were available during my care of the patient were reviewed by me and considered in my medical decision making (see chart for details).    MDM Rules/Calculators/A&P  Initial impression-patient presents with lower back pain.  He is alert, does not appear acute stress, vital signs reassuring.  Due to mechanism injury and hearing a pop concern for possible fracture will obtain CT lumbar spine for further evaluation.  Work-up-CT lumbar spine negative for acute findings.  Rule out-I have low suspicion for spinal fracture or spinal cord abnormality as patient denies urinary incontinency, retention, difficulty with bowel movements, denies saddle paresthesias.  Spine was palpated there is no step-off, crepitus or gross deformities felt, patient has 5/5 strength, full range of motion, neurovascular fully intact in the lower extremities.  Imaging negative for fractures or dislocation. . Low suspicion for septic arthritis/spinal abscess as patient denies IV drug use, skin exam was performed no erythematous, edema or warm joints noted.   Plan-  Patient presents with acute back pain.-Suspect muscular strain, it is noted that patient does have disc protrusion which should also add  to his pain, will provide him with steroids, muscle relaxers follow-up with PCP for further evaluation.  Patient was given copy of radiology recommendations.  Vital signs have remained stable, no indication for hospital admission. Patient given at home care as well strict return precautions.  Patient verbalized that they understood agreed to said plan.  Final Clinical Impression(s) / ED Diagnoses Final diagnoses:  Acute midline low back pain without sciatica    Rx / DC Orders ED Discharge Orders          Ordered    predniSONE (DELTASONE) 10 MG tablet  Daily        02/11/21 1930    cyclobenzaprine (FLEXERIL) 10 MG tablet  2 times daily PRN        02/11/21 1930             Barnie Del 02/11/21 1947    Eber Hong, MD 02/11/21 [phone]

## 2021-02-11 NOTE — ED Triage Notes (Signed)
Pt bent over to pick some laundry up from floor and as bending over felt something pop in lower back and has pain since.

## 2021-02-12 ENCOUNTER — Telehealth: Payer: Self-pay

## 2021-02-12 NOTE — Telephone Encounter (Signed)
Transition Care Management Follow-up Telephone Call Date of discharge and from where: 02/11/2021- [facility] How have you been since you were released from the hospital? Pain is getting better. Any questions or concerns? No  Items Reviewed: Did the pt receive and understand the discharge instructions provided? Yes  Medications obtained and verified? Yes  Other? No  Any new allergies since your discharge? No  Dietary orders reviewed? No Do you have support at home? Yes   Home Care and Equipment/Supplies: Were home health services ordered? not applicable If so, what is the name of the agency? N/A Has the agency set up a time to come to the patient's home? not applicable Were any new equipment or medical supplies ordered?  No What is the name of the medical supply agency? N/A Were you able to get the supplies/equipment? not applicable Do you have any questions related to the use of the equipment or supplies? No  Functional Questionnaire: (I = Independent and D = Dependent) ADLs: I  Bathing/Dressing- I  Meal Prep- I  Eating- I  Maintaining continence- I  Transferring/Ambulation- I  Managing Meds- I  Follow up appointments reviewed:  PCP Hospital f/u appt confirmed? No  Patient was provided with [facility] information for new PCP. Specialist Hospital f/u appt confirmed? No  Patient advised to contact [facility] Are transportation arrangements needed? No  If their condition worsens, is the pt aware to call PCP or go to the Emergency Dept.? Yes Was the patient provided with contact information for the PCP's office or ED? Yes-ED and [facility] Was to pt encouraged to call back with questions or concerns? Yes

## 2021-04-26 ENCOUNTER — Emergency Department (HOSPITAL_COMMUNITY)
Admission: EM | Admit: 2021-04-26 | Discharge: 2021-04-26 | Disposition: A | Discharge status: Home / Self Care | Payer: Medicaid Other | Attending: Emergency Medicine | Admitting: Emergency Medicine

## 2021-04-26 ENCOUNTER — Encounter (HOSPITAL_COMMUNITY): Payer: Self-pay | Admitting: Emergency Medicine

## 2021-04-26 ENCOUNTER — Other Ambulatory Visit: Payer: Self-pay

## 2021-04-26 DIAGNOSIS — U071 COVID-19: Secondary | ICD-10-CM | POA: Diagnosis not present

## 2021-04-26 DIAGNOSIS — F17210 Nicotine dependence, cigarettes, uncomplicated: Secondary | ICD-10-CM | POA: Insufficient documentation

## 2021-04-26 DIAGNOSIS — J029 Acute pharyngitis, unspecified: Secondary | ICD-10-CM | POA: Diagnosis present

## 2021-04-26 LAB — RESP PANEL BY RT-PCR (FLU A&B, COVID) ARPGX2
Influenza A by PCR: NEGATIVE
Influenza B by PCR: NEGATIVE
SARS Coronavirus 2 by RT PCR: POSITIVE — AB

## 2021-04-26 MED ORDER — ACETAMINOPHEN 325 MG PO TABS: 650.0000 mg | ORAL_TABLET | Freq: Four times a day (QID) | ORAL | 0 refills | Status: AC | PRN

## 2021-04-26 MED ORDER — MENTHOL 3 MG MT LOZG: 1.0000 | LOZENGE | OROMUCOSAL | 0 refills | Status: AC | PRN

## 2021-04-26 MED ORDER — MENTHOL 3 MG MT LOZG
1.0000 | LOZENGE | OROMUCOSAL | Status: DC | PRN
  Administered 2021-04-26: 3 mg via ORAL
  Filled 2021-04-26: qty 9

## 2021-04-26 NOTE — ED Provider Notes (Signed)
[facility] EMERGENCY DEPARTMENT Provider Note   CSN: [id number] Arrival date & time: 04/26/21  7829     History Chief Complaint  Patient presents with  . Sore Throat    Benjamin Ingram is a 35 y.o. male.  35 yo male with hx tobacco use presents to ED for COVID19 test that he needs for work. Sore throat started last night, woke up this morning still had sore throat so he took home COVID19 test which was positive. He has no dyspnea, no cp, no fevers or chills, no n/v/d, no urination changes. He is tolerating PO without any difficulty. No drooling, no voice changes, no difficulty with swallowing, no stridor, no difficulty opening his jaw.  No neck pain.  No recent dental procedures. No neck trauma. No other acute complaints offered.  Sick contacts with son who has COVID19  The history is provided by the patient. No language interpreter was used.  Sore Throat This is a new problem. The current episode started yesterday. The problem has not changed since onset.Pertinent negatives include no chest pain, no abdominal pain, no headaches and no shortness of breath. He has tried nothing for the symptoms.      Past Medical History:  Diagnosis Date  . Headache   . Vertigo     There are no problems to display for this patient.   History reviewed. No pertinent surgical history.     Family History  Problem Relation Age of Onset  . Healthy Mother   . Healthy Father     Social History   Tobacco Use  . Smoking status: Every Day    Packs/day: 0.50    Types: E-cigarettes, Cigarettes  . Smokeless tobacco: Never  Vaping Use  . Vaping Use: Every day  . Substances: Nicotine  Substance Use Topics  . Alcohol use: Never    Comment: rarely  . Drug use: No    Home Medications Prior to Admission medications   Medication Sig Start Date End Date Taking? Authorizing Provider  acetaminophen (TYLENOL) 325 MG tablet Take 2 tablets (650 mg total) by mouth every 6 (six) hours as needed.  04/26/21  Yes Sloan Leiter, DO  menthol-cetylpyridinium (CEPACOL) 3 MG lozenge Take 1 lozenge (3 mg total) by mouth as needed for sore throat. 04/26/21  Yes Tanda Rockers A, DO  benzonatate (TESSALON) 100 MG capsule Take 2 capsules (200 mg total) by mouth 2 (two) times daily as needed for cough. Patient not taking: No sig reported 12/12/19   Eber Hong, MD  cyclobenzaprine (FLEXERIL) 10 MG tablet Take 1 tablet (10 mg total) by mouth 2 (two) times daily as needed for muscle spasms. Patient not taking: No sig reported 02/11/21   Carroll Sage, PA-C  diclofenac (VOLTAREN) 75 MG EC tablet Take 1 tablet (75 mg total) by mouth 2 (two) times daily. Patient not taking: No sig reported 12/20/19   Elson Areas, PA-C  methocarbamol (ROBAXIN) 500 MG tablet Take 1 tablet (500 mg total) by mouth 4 (four) times daily. Patient not taking: No sig reported 10/16/20   Bing Neighbors, FNP  moxifloxacin (VIGAMOX) 0.5 % ophthalmic solution Place 1 drop into the left eye 3 (three) times daily. Patient not taking: No sig reported 03/11/20   Jeannie Fend, PA-C  oseltamivir (TAMIFLU) 75 MG capsule Take 1 capsule (75 mg total) by mouth every 12 (twelve) hours. Patient not taking: No sig reported 12/12/19   Eber Hong, MD  promethazine (PHENERGAN) 25 MG tablet Take  1 tablet (25 mg total) by mouth every 6 (six) hours as needed for nausea or vomiting (or dizziness/vertigo). Patient not taking: No sig reported 01/25/17   Garrus Gauthreaux Jester, DO    Allergies    Pineapple  Review of Systems   Review of Systems  Constitutional:  Negative for chills and fever.  HENT:  Positive for sore throat. Negative for facial swelling and trouble swallowing.   Eyes:  Negative for photophobia and visual disturbance.  Respiratory:  Negative for cough and shortness of breath.   Cardiovascular:  Negative for chest pain and palpitations.  Gastrointestinal:  Negative for abdominal pain, nausea and vomiting.  Endocrine: Negative for  polydipsia and polyuria.  Genitourinary:  Negative for difficulty urinating and hematuria.  Musculoskeletal:  Negative for gait problem and joint swelling.  Skin:  Negative for pallor and rash.  Neurological:  Negative for syncope and headaches.  Psychiatric/Behavioral:  Negative for agitation and confusion.    Physical Exam Updated Vital Signs BP (!) 152/93 (BP Location: Right Arm)   Pulse 89   Temp 98.6 F (37 C) (Oral)   Resp 18   Ht 5\' 8"  (1.727 m)   Wt 104.3 kg   SpO2 97%   BMI 34.97 kg/m   Physical Exam Vitals and nursing note reviewed.  Constitutional:      General: He is not in acute distress.    Appearance: Normal appearance. He is well-developed.  HENT:     Head: Normocephalic and atraumatic.     Right Ear: External ear normal.     Left Ear: External ear normal.     Mouth/Throat:     Mouth: Mucous membranes are moist.     Pharynx: Oropharynx is clear. Uvula midline.  Eyes:     General: No scleral icterus.    Pupils: Pupils are equal, round, and reactive to light.  Cardiovascular:     Rate and Rhythm: Normal rate and regular rhythm.     Pulses: Normal pulses.     Heart sounds: Normal heart sounds.  Pulmonary:     Effort: Pulmonary effort is normal. No respiratory distress.     Breath sounds: Normal breath sounds.  Abdominal:     General: Abdomen is flat.     Palpations: Abdomen is soft.     Tenderness: There is no abdominal tenderness.  Musculoskeletal:        General: Normal range of motion.     Cervical back: Normal range of motion.     Right lower leg: No edema.     Left lower leg: No edema.  Skin:    General: Skin is warm and dry.     Capillary Refill: Capillary refill takes less than 2 seconds.  Neurological:     Mental Status: He is alert and oriented to person, place, and time.  Psychiatric:        Mood and Affect: Mood normal.        Behavior: Behavior normal.    ED Results / Procedures / Treatments   Labs (all labs ordered are listed,  but only abnormal results are displayed) Labs Reviewed  RESP PANEL BY RT-PCR (FLU A&B, COVID) ARPGX2 - Abnormal; Notable for the following components:      Result Value   SARS Coronavirus 2 by RT PCR POSITIVE (*)    All other components within normal limits    EKG None  Radiology No results found.  Procedures Procedures   Medications Ordered in ED Medications  menthol-cetylpyridinium (CEPACOL) lozenge  3 mg (3 mg Oral Given 04/26/21 5621)    ED Course  I have reviewed the triage vital signs and the nursing notes.  Pertinent labs & imaging results that were available during my care of the patient were reviewed by me and considered in my medical decision making (see chart for details).    MDM Rules/Calculators/A&P                           35 yo male, well appearing, in ED for COVID19 test. Sore throat reported. No evidence of deep space infection to oropharynx. He is tolerating PO, no drooling or stridor. No trismus. Speaking clearly and in full sentences. No respiratory distress. Moving neck freely. Vital signs reviewed, serious etiology considered.  COVID19 testing positive. Given symptoms are mild at this time and patient is vaccinated, generally healthy and not ill appearing will hold off on anti-virals at this time. Advised pt to f/u with PCP regarding symptoms in next 24-48 hours.  The patient improved significantly and was discharged in stable condition. Detailed discussions were had with the patient regarding current findings, and need for close f/u with PCP or on call doctor. The patient has been instructed to return immediately if the symptoms worsen in any way for re-evaluation. Patient verbalized understanding and is in agreement with current care plan. All questions answered prior to discharge.     Final Clinical Impression(s) / ED Diagnoses Final diagnoses:  COVID-19    Rx / DC Orders ED Discharge Orders          Ordered    MyChart COVID-19 home monitoring  program        04/26/21 0932    Temperature monitoring        04/26/21 0932    menthol-cetylpyridinium (CEPACOL) 3 MG lozenge  As needed        04/26/21 0933    acetaminophen (TYLENOL) 325 MG tablet  Every 6 hours PRN        04/26/21 0933             Sloan Leiter, DO 04/26/21 [phone]

## 2021-04-26 NOTE — ED Triage Notes (Signed)
Pt states he woke up this am with sore throat and chills. Took home covid test and was positive, wanted to come in to make sure it was covid.

## 2021-04-26 NOTE — Discharge Instructions (Addendum)
Please follow up with your PCP if symptoms worsen; if you develop difficulty breathing or inability to tolerate oral intake. And worsening or worrisome symptoms.

## 2021-04-27 ENCOUNTER — Telehealth: Payer: Self-pay

## 2021-04-27 NOTE — Telephone Encounter (Signed)
Transition Care Management Follow-up Telephone Call Date of discharge and from where: 04/26/2021-[facility]  How have you been since you were released from the hospital? Patient stated he is doing ok.  Any questions or concerns? No  Items Reviewed: Did the pt receive and understand the discharge instructions provided? Yes  Medications obtained and verified?  No medication sent to pharmacy Other? No  Any new allergies since your discharge? No  Dietary orders reviewed? No Do you have support at home? Yes   Home Care and Equipment/Supplies: Were home health services ordered? not applicable If so, what is the name of the agency? N/A  Has the agency set up a time to come to the patient's home? not applicable Were any new equipment or medical supplies ordered?  No What is the name of the medical supply agency? N/A Were you able to get the supplies/equipment? not applicable Do you have any questions related to the use of the equipment or supplies? No  Functional Questionnaire: (I = Independent and D = Dependent) ADLs: I  Bathing/Dressing- I  Meal Prep- I  Eating- I  Maintaining continence- I  Transferring/Ambulation- I  Managing Meds- I  Follow up appointments reviewed:  PCP Hospital f/u appt confirmed? No   Specialist Hospital f/u appt confirmed? No   Are transportation arrangements needed? No  If their condition worsens, is the pt aware to call PCP or go to the Emergency Dept.? Yes Was the patient provided with contact information for the PCP's office or ED? Yes Was to pt encouraged to call back with questions or concerns? Yes

## 2021-10-13 ENCOUNTER — Emergency Department (HOSPITAL_COMMUNITY)
Admission: EM | Admit: 2021-10-13 | Discharge: 2021-10-13 | Disposition: A | Discharge status: Home / Self Care | Payer: Medicaid Other | Attending: Emergency Medicine | Admitting: Emergency Medicine

## 2021-10-13 ENCOUNTER — Other Ambulatory Visit: Payer: Self-pay

## 2021-10-13 ENCOUNTER — Emergency Department (HOSPITAL_COMMUNITY): Payer: Medicaid Other

## 2021-10-13 ENCOUNTER — Encounter (HOSPITAL_COMMUNITY): Payer: Self-pay | Admitting: Emergency Medicine

## 2021-10-13 DIAGNOSIS — M545 Low back pain, unspecified: Secondary | ICD-10-CM | POA: Insufficient documentation

## 2021-10-13 MED ORDER — KETOROLAC TROMETHAMINE 15 MG/ML IJ SOLN
15.0000 mg | Freq: Once | INTRAMUSCULAR | Status: AC
  Administered 2021-10-13: 15 mg via INTRAVENOUS
  Filled 2021-10-13: qty 1

## 2021-10-13 MED ORDER — NAPROXEN 500 MG PO TABS: 500.0000 mg | ORAL_TABLET | Freq: Two times a day (BID) | ORAL | 0 refills | Status: AC

## 2021-10-13 MED ORDER — LIDOCAINE 5 % EX PTCH: 1.0000 | MEDICATED_PATCH | CUTANEOUS | 0 refills | Status: AC

## 2021-10-13 MED ORDER — LIDOCAINE 5 % EX PTCH
1.0000 | MEDICATED_PATCH | CUTANEOUS | Status: DC
  Administered 2021-10-13: 1 via TRANSDERMAL
  Filled 2021-10-13: qty 1

## 2021-10-13 NOTE — Discharge Instructions (Signed)
Please use lidocaine patches, Flexeril, and Flexeril as needed for muscle pain.  Return to the emergency department for worsening symptoms. ?

## 2021-10-13 NOTE — ED Provider Notes (Signed)
?Benjamin Ingram ?Provider Note ? ? ?CSN: [id number] ?Arrival date & time: 10/13/21  1420 ? ?  ? ?History ?Chief Complaint  ?Patient presents with  ? Back Pain  ? ? ?Benjamin Ingram is a 36 y.o. male who presents to the emergency department with lower back pain that started yesterday after waking up.  Patient states he gets this periodically which he attributes to a "pinched nerve" after waking up.  Patient denies any radiating symptoms.  He denies any weakness or numbness to his legs.  Pain is worse when he bends or twists.  No bowel or bladder incontinence.  No fever or chills. ? ? ?Back Pain ? ?  ? ?Home Medications ?Prior to Admission medications   ?Medication Sig Start Date End Date Taking? Authorizing Provider  ?lidocaine (LIDODERM) 5 % Place 1 patch onto the skin daily. Remove & Discard patch within 12 hours or as directed by MD 10/13/21  Yes Raul Del, Lenix Kidd M, PA-C  ?naproxen (NAPROSYN) 500 MG tablet Take 1 tablet (500 mg total) by mouth 2 (two) times daily. 10/13/21  Yes Raul Del, Daysi Boggan M, PA-C  ?acetaminophen (TYLENOL) 325 MG tablet Take 2 tablets (650 mg total) by mouth every 6 (six) hours as needed. 04/26/21   Jeanell Sparrow, DO  ?benzonatate (TESSALON) 100 MG capsule Take 2 capsules (200 mg total) by mouth 2 (two) times daily as needed for cough. ?Patient not taking: No sig reported 12/12/19   Noemi Chapel, MD  ?cyclobenzaprine (FLEXERIL) 10 MG tablet Take 1 tablet (10 mg total) by mouth 2 (two) times daily as needed for muscle spasms. ?Patient not taking: No sig reported 02/11/21   Marcello Fennel, PA-C  ?diclofenac (VOLTAREN) 75 MG EC tablet Take 1 tablet (75 mg total) by mouth 2 (two) times daily. ?Patient not taking: No sig reported 12/20/19   Fransico Meadow, PA-C  ?menthol-cetylpyridinium (CEPACOL) 3 MG lozenge Take 1 lozenge (3 mg total) by mouth as needed for sore throat. 04/26/21   Jeanell Sparrow, DO  ?methocarbamol (ROBAXIN) 500 MG tablet Take 1 tablet (500 mg total) by mouth 4  (four) times daily. ?Patient not taking: No sig reported 10/16/20   Scot Jun, FNP  ?moxifloxacin (VIGAMOX) 0.5 % ophthalmic solution Place 1 drop into the left eye 3 (three) times daily. ?Patient not taking: No sig reported 03/11/20   Suella Broad A, PA-C  ?oseltamivir (TAMIFLU) 75 MG capsule Take 1 capsule (75 mg total) by mouth every 12 (twelve) hours. ?Patient not taking: No sig reported 12/12/19   Noemi Chapel, MD  ?promethazine (PHENERGAN) 25 MG tablet Take 1 tablet (25 mg total) by mouth every 6 (six) hours as needed for nausea or vomiting (or dizziness/vertigo). ?Patient not taking: No sig reported 01/25/17   Francine Graven, DO  ?   ? ?Allergies    ?Pineapple   ? ?Review of Systems   ?Review of Systems  ?Musculoskeletal:  Positive for back pain.  ?All other systems reviewed and are negative. ? ?Physical Exam ?Updated Vital Signs ?BP 137/79 (BP Location: Right Arm)   Pulse 68   Temp 97.8 ?F (36.6 ?C) (Oral)   Resp 16   Ht 5\' 6"  (1.676 m)   Wt 99.8 kg   SpO2 100%   BMI 35.51 kg/m?  ?Physical Exam ?Vitals and nursing note reviewed.  ?Constitutional:   ?   Appearance: Normal appearance.  ?HENT:  ?   Head: Normocephalic and atraumatic.  ?Eyes:  ?   General:     ?  Right eye: No discharge.     ?   Left eye: No discharge.  ?   Conjunctiva/sclera: Conjunctivae normal.  ?Pulmonary:  ?   Effort: Pulmonary effort is normal.  ?Musculoskeletal:  ?   Comments: There is mild midline lumbar tenderness.  Good strength and sensation to the lower extremities.  No para lumbar muscular tenderness.  ?Skin: ?   General: Skin is warm and dry.  ?   Findings: No rash.  ?Neurological:  ?   General: No focal deficit present.  ?   Mental Status: He is alert.  ?Psychiatric:     ?   Mood and Affect: Mood normal.     ?   Behavior: Behavior normal.  ? ? ?ED Results / Procedures / Treatments   ?Labs ?(all labs ordered are listed, but only abnormal results are displayed) ?Labs Reviewed - No data to  display ? ?EKG ?None ? ?Radiology ?DG Lumbar Spine Complete ? ?Result Date: 10/13/2021 ?CLINICAL DATA:  Low back pain EXAM: LUMBAR SPINE - COMPLETE 4+ VIEW COMPARISON:  None. FINDINGS: No recent fracture is seen. Last lumbar vertebra is transitional. 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 There is minimal retrolisthesis at L3-L4 level. Degenerative changes are noted in facet joints, more so at L5-S1 level. There is mild disc space narrowing at L4-L5 and L5-S1 levels. IMPRESSION: No recent fracture is seen. Degenerative changes are noted with disc space narrowing, bony spurs and facet hypertrophy at L4-L5 and L5-S1 levels. 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 There is minimal retrolisthesis at L3-L4 level. Electronically Signed   By: Elmer Picker M.D.   On: 10/13/2021 15:28   ? ?Procedures ?Procedures  ? ? ?Medications Ordered in ED ?Medications  ?lidocaine (LIDODERM) 5 % 1 patch (1 patch Transdermal Patch Applied 10/13/21 1506)  ?ketorolac (TORADOL) 15 MG/ML injection 15 mg (15 mg Intravenous Given 10/13/21 1506)  ? ? ?ED Course/ Medical Decision Making/ A&P ?  ?                        ?Medical Decision Making ?Amount and/or Complexity of Data Reviewed ?Radiology: ordered. ? ?Risk ?Prescription drug management. ? ? ?Benjamin Ingram is a 36 y.o. male who presents to the emergency department with lower back pain started 1 day ago.  This seems to be musculoskeletal in nature.  Have a low suspicion for any acute emergent pathology at this time including cauda equina, epidural abscess, fracture or dislocation.  Patient has complete sensation and normal strength in the lower extremities.  No bowel or bladder incontinence.  Patient's pain is improved with Toradol and Lidoderm patch given the department today.  X-ray did show degenerative changes which could be contributing to his pain.  Patient has plenty of Flexeril at home.  I will refill his naproxen given a prescription for Lidoderm patches.   Patient amenable this plan.  Again I discussed strict return precautions with him.  He is safe for discharge. ? ?Final Clinical Impression(s) / ED Diagnoses ?Final diagnoses:  ?Acute midline low back pain without sciatica  ? ? ?Rx / DC Orders ?ED Discharge Orders   ? ?      Ordered  ?  naproxen (NAPROSYN) 500 MG tablet  2 times daily       ? 10/13/21 1557  ?  lidocaine (LIDODERM) 5 %  Every 24 hours       ? 10/13/21 1557  ? ?  ?  ? ?  ? ? ?  ?  Myna Bright [place], PA-C ?10/13/21 1557 ? ?  ?Lajean Saver, MD ?10/15/21 1507 ? ?

## 2021-10-13 NOTE — ED Triage Notes (Signed)
Patient c/o lower back pain that is worse with movement that started yesterday . Denies any known injury. Patient denies any complications with urination. CNS intact. Denies radiation in pain. Per patient took some naproxen and flexeril yesterday with some relief.  ?

## 2022-02-28 IMAGING — CT CT L SPINE W/O CM
3 series · 12 of 33 positions shown, 14 images · non-contrast
Comparison: None.

CLINICAL DATA: Low back pain, trauma.

EXAM:
CT LUMBAR SPINE WITHOUT CONTRAST
TECHNIQUE: Multidetector CT imaging of the lumbar spine was performed without
intravenous contrast administration. Multiplanar CT image
reconstructions were also generated.

[Series 4: l spine soft · axial · 0.39mm/px · z∈[+1688,+1852]mm · 4 of 120 slices shown, 5 images]
[im 19/120  soft-tissue]
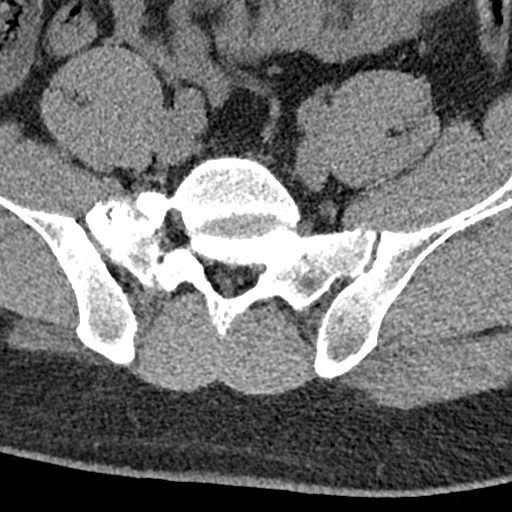
[im 19/120  bone]
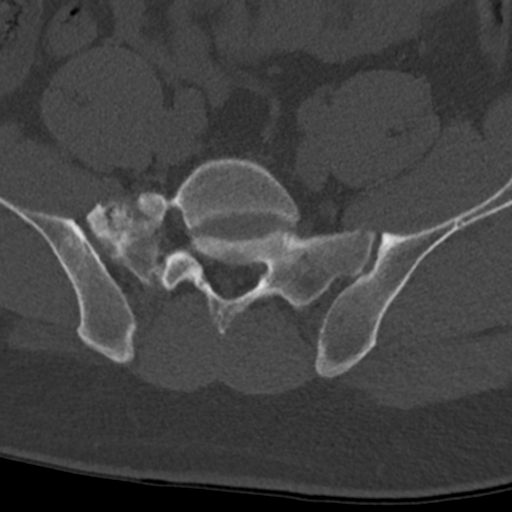
[im 46/120  bone]
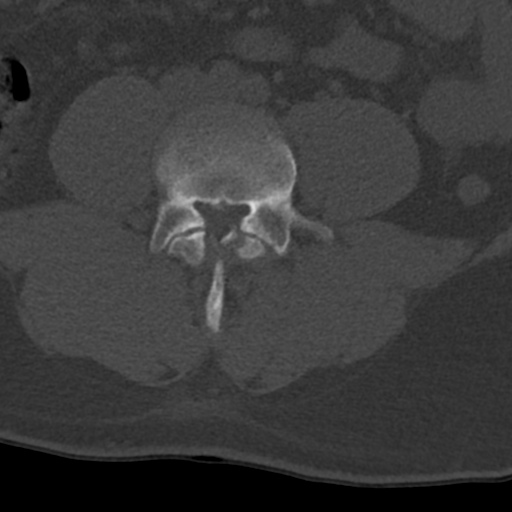
[im 74/120  bone]
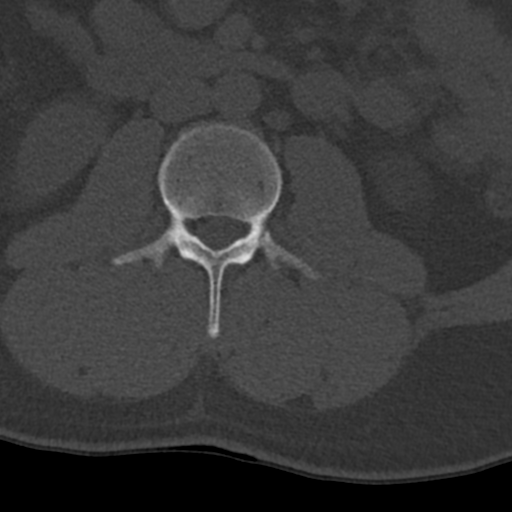
[im 101/120  bone]
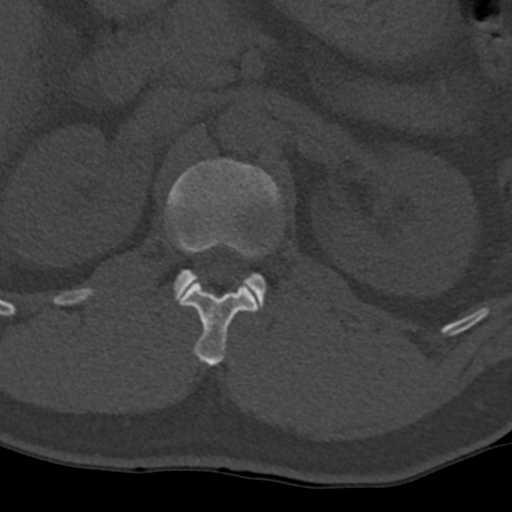

[Series 5: sagittal bone · sagittal · 0.38mm/px · 5 of 70 slices shown, 6 images]
[im 24/70  bone]
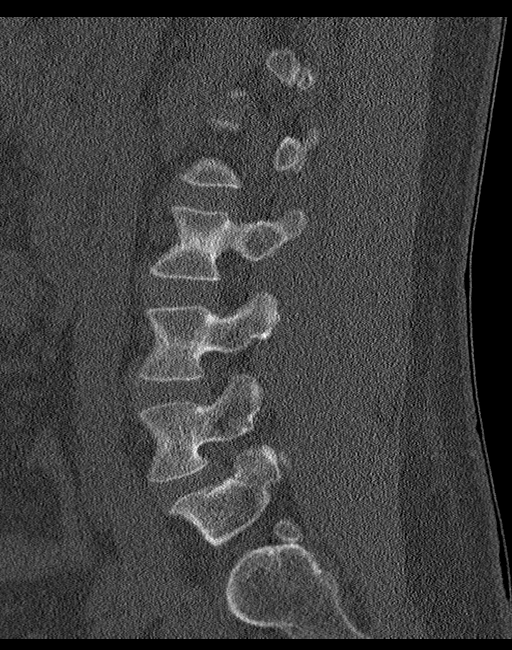
[im 29/70  bone]
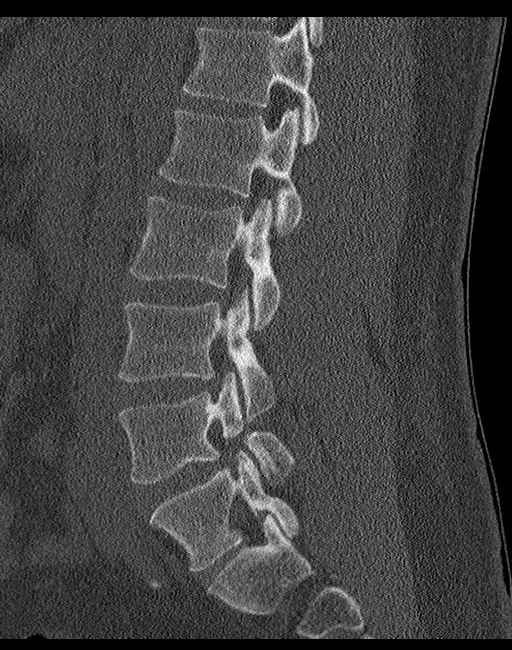
[im 35/70  soft-tissue]
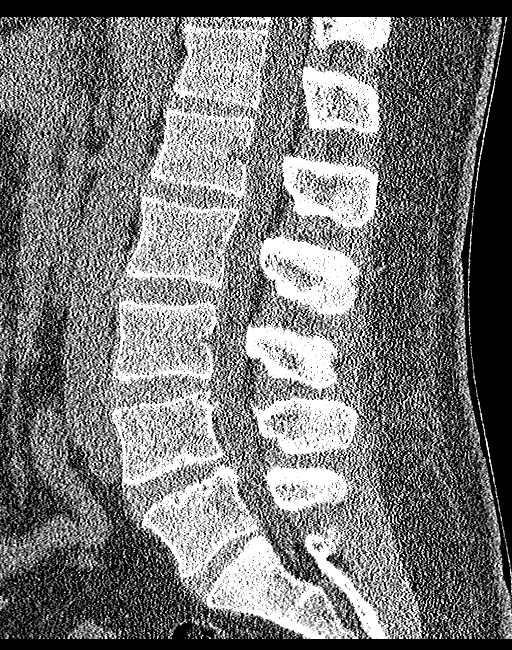
[im 35/70  bone]
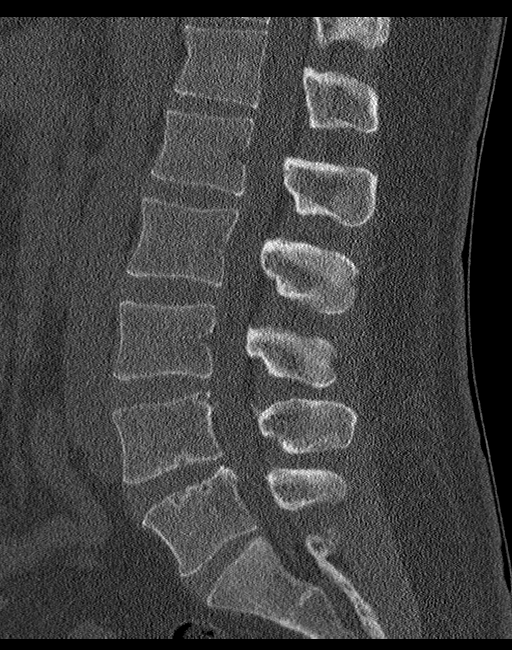
[im 41/70  bone]
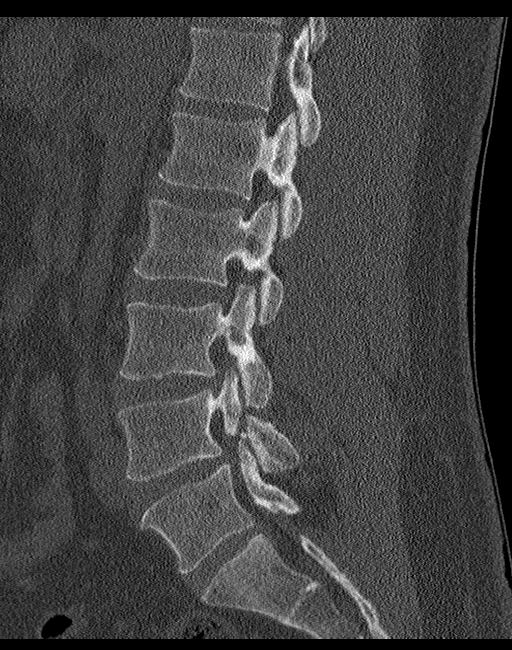
[im 47/70  bone]
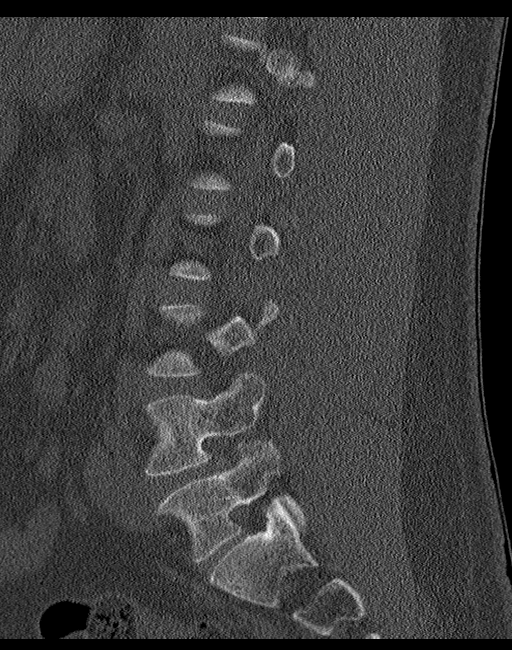

[Series 6: coronal bone · coronal · 0.27mm/px · 3 of 99 slices shown]
[im 20/99  bone]
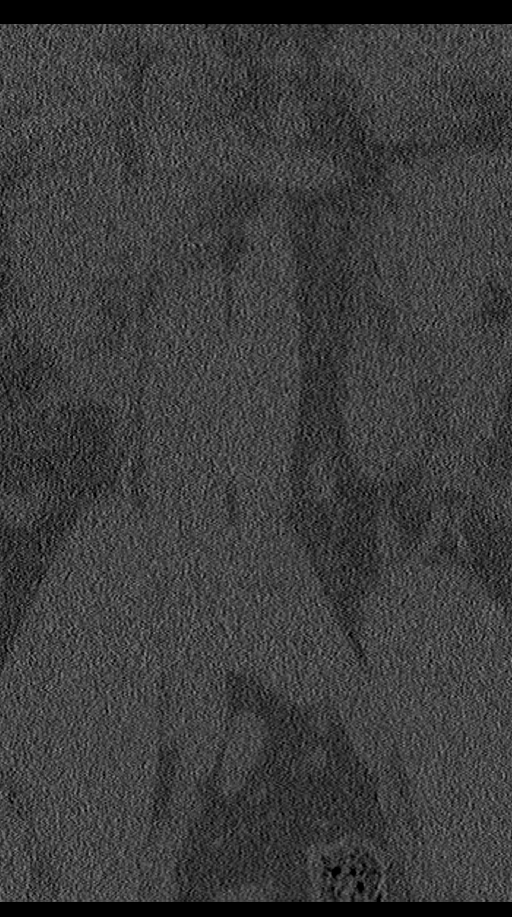
[im 40/99  bone]
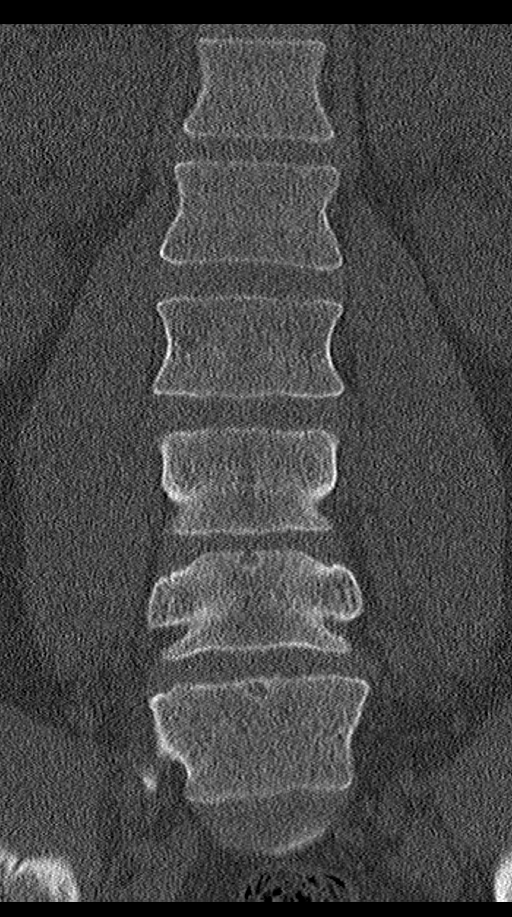
[im 59/99  bone]
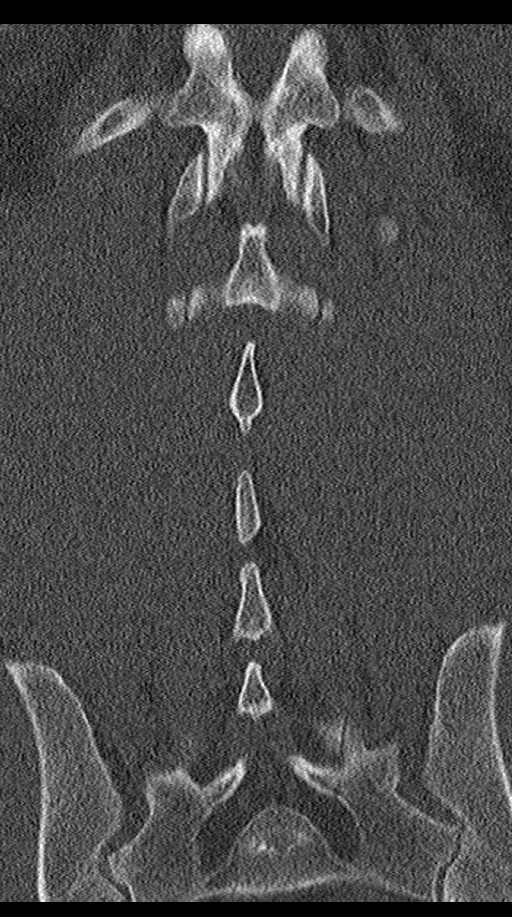

[12 of 33 positions shown; findings below may reference images not displayed]

FINDINGS: Segmentation: 5 non rib-bearing lumbar vertebral bodies.

Alignment: No substantial sagittal subluxation. Mild
dextrocurvature.

Vertebrae: Vertebral body heights are maintained. Bilateral node
appearing pars defects at L4, which are corticated.
Pseudoarticulation of the right L5 transverse process with the
sacrum.

Paraspinal and other soft tissues: Unremarkable.

Disc levels: Degenerative disease at L4-L5 with disc height loss and
endplate Schmorl's nodes. There also is a broad disc bulge at this
level with ligamentum flavum thickening and suspected at least
moderate canal and bilateral subarticular recess stenosis.
Additionally, there is suggestion of a disc bulges/protrusions at
L1-L2 and L3-L4 with potential canal stenosis at these levels.
IMPRESSION: 1. No evidence of acute fracture or traumatic subluxation.
2. Remote appearing/corticated bilateral L4 pars defects without
substantial subluxation. There is degenerative disc disease at this
level and a broad disc bulge with suspected at least moderate canal
and bilateral subarticular recess stenosis. Additionally, there is
suggestion of a disc bulges/protrusions at L1-L2 and L3-L4 with
potential canal stenosis at these levels. Recommend an MRI to better
evaluate the canal.
